# Patient Record
Sex: Female | Born: 1980 | Race: White | Hispanic: No | Marital: Single | Smoking: Never smoker
Health system: Southern US, Community
[De-identification: ages and names within clinical notes are randomized; demographics above are authoritative.]

## PROBLEM LIST (undated history)

## (undated) DIAGNOSIS — Z9889 Other specified postprocedural states: Secondary | ICD-10-CM

## (undated) DIAGNOSIS — D259 Leiomyoma of uterus, unspecified: Secondary | ICD-10-CM

## (undated) DIAGNOSIS — O3410 Maternal care for benign tumor of corpus uteri, unspecified trimester: Secondary | ICD-10-CM

## (undated) DIAGNOSIS — R112 Nausea with vomiting, unspecified: Secondary | ICD-10-CM

---

## 2007-05-05 HISTORY — PX: BREAST SURGERY: SHX581

## 2008-05-14 ENCOUNTER — Emergency Department: Payer: Self-pay | Admitting: Internal Medicine

## 2009-01-14 ENCOUNTER — Encounter: Payer: Self-pay | Admitting: Obstetrics and Gynecology

## 2009-02-01 ENCOUNTER — Encounter: Payer: Self-pay | Admitting: Obstetrics and Gynecology

## 2009-05-04 HISTORY — PX: TUBAL LIGATION: SHX77

## 2009-07-17 ENCOUNTER — Inpatient Hospital Stay: Payer: Self-pay

## 2009-09-03 ENCOUNTER — Ambulatory Visit: Payer: Self-pay | Admitting: Obstetrics and Gynecology

## 2009-09-06 ENCOUNTER — Ambulatory Visit: Payer: Self-pay | Admitting: Obstetrics and Gynecology

## 2017-12-31 ENCOUNTER — Other Ambulatory Visit: Payer: Self-pay | Admitting: Obstetrics and Gynecology

## 2017-12-31 DIAGNOSIS — D219 Benign neoplasm of connective and other soft tissue, unspecified: Secondary | ICD-10-CM

## 2018-01-16 ENCOUNTER — Ambulatory Visit
Admission: RE | Admit: 2018-01-16 | Discharge: 2018-01-16 | Disposition: A | Discharge status: Home / Self Care | Payer: 59 | Source: Ambulatory Visit | Attending: Obstetrics and Gynecology | Admitting: Obstetrics and Gynecology

## 2018-01-16 DIAGNOSIS — D251 Intramural leiomyoma of uterus: Secondary | ICD-10-CM | POA: Insufficient documentation

## 2018-01-16 DIAGNOSIS — N800 Endometriosis of uterus: Secondary | ICD-10-CM | POA: Diagnosis not present

## 2018-01-16 DIAGNOSIS — D219 Benign neoplasm of connective and other soft tissue, unspecified: Secondary | ICD-10-CM

## 2018-01-16 MED ORDER — GADOBENATE DIMEGLUMINE 529 MG/ML IV SOLN
14.0000 mL | Freq: Once | INTRAVENOUS | Status: AC | PRN
  Administered 2018-01-16: 14 mL via INTRAVENOUS

## 2018-04-14 NOTE — H&P (Signed)
Patient ID: Tracey Mueller is a 37 y.o. female presenting with Pre Op Consulting  on 04/14/2018  HPI: Pt presents with RLQ pelvic pain and increasing heavy bleeding. Ultrasound found a 12cm fibroid with an 18wk sized uterus.  MRI 01/17/2018 confirmed 14cm fibroid, and uterus is now 96GE and above umbilicus.  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  Past Surgical History:  has a past surgical history that includes Laparoscopic tubal ligation (Bilateral, 09/06/2009); Breast implants; and Tubal ligation. Family History: family history includes No Known Problems in her father and mother. Social History:  reports that she has never smoked. She has never used smokeless tobacco. She reports that she does not drink alcohol or use drugs. OB/GYN History:          OB History    Gravida  2   Para  2   Term  2   Preterm      AB      Living  2     SAB      TAB      Ectopic      Molar      Multiple      Live Births  2          Allergies: has No Known Allergies. Medications:  Current Outpatient Medications:  .  lidocaine (XYLOCAINE) 2 % jelly, Apply topically as needed for Pain. (Patient not taking: Reported on 05/21/2017 ), Disp: 30 mL, Rfl: 1   Review of Systems: No SOB, no palpitations or chest pain, no new lower extremity edema, no nausea or vomiting or bowel or bladder complaints. See HPI for gyn specific ROS.   Exam:   BP 122/85   Pulse 91   Ht 175.3 cm (5\' 9" )   Wt 73.9 kg (163 lb)   LMP 04/04/2018 (Exact Date)   BMI 24.07 kg/m   General: Patient is well-groomed, well-nourished, appears stated age in no acute distress  HEENT: head is atraumatic and normocephalic, trachea is midline, neck is  supple with no palpable nodules  CV: Regular rhythm and normal heart rate, no murmur  Pulm: Clear to auscultation throughout lung fields with no wheezing, crackles, or rhonchi. No increased work of breathing  Abdomen: soft , no mass, non-tender, no rebound tenderness, no hepatomegaly  Pelvic: tanner stage 5 ,              External genitalia: vulva /labia no lesions             Urethra: no prolapse             Vagina: normal physiologic d/c, laxity in vaginal walls             Cervix: no lesions, no cervical motion tenderness, good descent             Uterus: 22cm, 2 fingerbreadths above umbilicus and filling the right pelvis             Adnexa: no mass,  non-tender               Rectovaginal: External wnl   Impression:   The primary encounter diagnosis was Intramural leiomyoma of uterus. A diagnosis of Abnormal uterine bleeding (AUB), unspecified was also pertinent to this visit.  Plan:   Fibroids: Because her fundus now is positioned several cm above the umbilicus and, though mobile, does fill the right pelvis, we are moving from [facility] to RATLH/BS with hand morcellation using the ExCite, contained in bag at the umbilicus, Dr. Leonides Schanz and Justice Britain if possible, open above umbilicus with Hasson technique, use 3 robot arms for grasping and retraction, placement and removal of ureteral stents, possible supracervical, possible TAH, BS, cysto.  The patient and I discussed the technical aspects of the surgical procedure including the potential for risks and complications. These include but are not limited to the risk of infection requiring post-operative antibiotics or further procedures. We talked about the risk of injury to adjacent organs including bladder, bowel, ureter, blood vessels or nerves. We talked about the need to convert to an open incision. We talked about the possible need for blood transfusion. We talked aboutpostop complications such asthromboembolic or  cardiopulmonary complications. All of her questions were answered.   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

## 2018-04-15 ENCOUNTER — Other Ambulatory Visit: Payer: Self-pay

## 2018-04-15 ENCOUNTER — Encounter
Admission: RE | Admit: 2018-04-15 | Discharge: 2018-04-15 | Disposition: A | Discharge status: Home / Self Care | Payer: 59 | Source: Ambulatory Visit | Attending: Obstetrics and Gynecology | Admitting: Obstetrics and Gynecology

## 2018-04-15 ENCOUNTER — Inpatient Hospital Stay: Admission: RE | Admit: 2018-04-15 | Payer: Self-pay | Source: Ambulatory Visit

## 2018-04-15 DIAGNOSIS — Z01812 Encounter for preprocedural laboratory examination: Secondary | ICD-10-CM | POA: Diagnosis present

## 2018-04-15 HISTORY — DX: Nausea with vomiting, unspecified: R11.2

## 2018-04-15 HISTORY — DX: Maternal care for benign tumor of corpus uteri, unspecified trimester: O34.10

## 2018-04-15 HISTORY — DX: Leiomyoma of uterus, unspecified: D25.9

## 2018-04-15 HISTORY — DX: Nausea with vomiting, unspecified: Z98.890

## 2018-04-15 LAB — TYPE AND SCREEN
ABO/RH(D): O POS
Antibody Screen: NEGATIVE

## 2018-04-15 LAB — CBC
HCT: 42 % (ref 36.0–46.0)
Hemoglobin: 13.5 g/dL (ref 12.0–15.0)
MCH: 29 pg (ref 26.0–34.0)
MCHC: 32.1 g/dL (ref 30.0–36.0)
MCV: 90.3 fL (ref 80.0–100.0)
Platelets: 305 10*3/uL (ref 150–400)
RBC: 4.65 MIL/uL (ref 3.87–5.11)
RDW: 13.4 % (ref 11.5–15.5)
WBC: 6.3 10*3/uL (ref 4.0–10.5)
nRBC: 0 % (ref 0.0–0.2)

## 2018-04-15 LAB — BASIC METABOLIC PANEL
Anion gap: 5 (ref 5–15)
BUN: 15 mg/dL (ref 6–20)
CO2: 27 mmol/L (ref 22–32)
Calcium: 8.4 mg/dL — ABNORMAL LOW (ref 8.9–10.3)
Chloride: 105 mmol/L (ref 98–111)
Creatinine, Ser: 0.76 mg/dL (ref 0.44–1.00)
GFR calc Af Amer: >60 mL/min (ref >60)
GFR calc non Af Amer: >60 mL/min (ref >60)
Glucose, Bld: 119 mg/dL — ABNORMAL HIGH (ref 70–99)
Potassium: 3.4 mmol/L — ABNORMAL LOW (ref 3.5–5.1)
Sodium: 137 mmol/L (ref 135–145)

## 2018-04-15 NOTE — Patient Instructions (Addendum)
Your procedure is scheduled on: Monday 05/02/18.   Report to DAY SURGERY DEPARTMENT LOCATED ON 2ND FLOOR MEDICAL MALL ENTRANCE. To find out your arrival time please call [phone] between 1PM - 3PM on Friday 04/29/18.   Remember: Instructions that are not followed completely may result in serious medical risk, up to and including death, or upon the discretion of your surgeon and anesthesiologist your surgery may need to be rescheduled.      _X__ 1. Do not eat food after midnight the night before your procedure.                 No gum chewing or hard candies. You may drink clear liquids up to 2 hours                 before you are scheduled to arrive for your surgery- DO NOT drink clear                 liquids within 2 hours of the start of your surgery.                 Clear Liquids include:  water, apple juice without pulp, clear carbohydrate                 drink such as Clearfast or Gatorade, Black Coffee or Tea (Do not add creamer to                 coffee or tea).   __X__2.  On the morning of surgery brush your teeth with toothpaste and water, you may rinse your mouth with mouthwash if you wish.  Do not swallow any toothpaste or mouthwash.      _X__ 3.  No Alcohol for 24 hours before or after surgery.    _X__ 4.  Do Not Smoke or use e-cigarettes For 24 Hours Prior to Your Surgery.                 Do not use any chewable tobacco products for at least 6 hours prior to                 Surgery.   __X__5.  Notify your doctor if there is any change in your medical condition      (cold, fever, infections).     Do not wear jewelry, make-up, hairpins, clips or nail polish. Do not wear lotions, powders, or perfumes.  Do not shave 48 hours prior to surgery. Men may shave face and neck. Do not bring valuables to the hospital.     [facility] is not responsible for any belongings or valuables.   Contacts, dentures/partials or body piercings may not be worn into surgery. Bring a  case for your contacts, glasses or hearing aids, a denture cup will be supplied. Leave your suitcase in the car. After surgery it may be brought to your room.   For patients admitted to the hospital, discharge time is determined by your treatment team.    Patients discharged the day of surgery will not be allowed to drive home.    Please read over the following fact sheets that you were given:   MRSA Information    __X__ Take these medicines the morning of surgery with A SIP OF WATER:     1.   2.   3.   4.  5.  6.  __X__ Drink the Ensure Pre-Surgery Drink the morning of surgery on the way to the hospital.  __X__ Use CHG Soap as directed   __X__ Stop Anti-inflammatories 7 days before surgery such as Advil, Ibuprofen, Motrin, BC or Goodies Powder, Naprosyn, Naproxen, Aleve, Aspirin, Meloxicam. Last dose Monday 04/25/18.  May take Tylenol if needed for pain or discomfort.    __X__ Do not begin taking any herbal supplements before your surgery.

## 2018-05-01 MED ORDER — CEFAZOLIN SODIUM-DEXTROSE 2-4 GM/100ML-% IV SOLN
2.0000 g | INTRAVENOUS | Status: AC
  Administered 2018-05-02 (×2): 2 g via INTRAVENOUS

## 2018-05-02 ENCOUNTER — Observation Stay
Admission: RE | Admit: 2018-05-02 | Discharge: 2018-05-03 | Disposition: A | Discharge status: Home / Self Care | Payer: 59 | Source: Ambulatory Visit | Attending: Obstetrics and Gynecology | Admitting: Obstetrics and Gynecology

## 2018-05-02 ENCOUNTER — Ambulatory Visit: Payer: 59 | Admitting: Anesthesiology

## 2018-05-02 ENCOUNTER — Encounter
Admission: RE | Disposition: A | Discharge status: Home / Self Care | Payer: Self-pay | Source: Ambulatory Visit | Attending: Obstetrics and Gynecology

## 2018-05-02 ENCOUNTER — Encounter: Payer: Self-pay | Admitting: Emergency Medicine

## 2018-05-02 ENCOUNTER — Ambulatory Visit: Payer: 59

## 2018-05-02 ENCOUNTER — Other Ambulatory Visit: Payer: Self-pay

## 2018-05-02 DIAGNOSIS — D259 Leiomyoma of uterus, unspecified: Secondary | ICD-10-CM | POA: Diagnosis present

## 2018-05-02 DIAGNOSIS — D252 Subserosal leiomyoma of uterus: Secondary | ICD-10-CM | POA: Diagnosis present

## 2018-05-02 DIAGNOSIS — N8301 Follicular cyst of right ovary: Secondary | ICD-10-CM | POA: Insufficient documentation

## 2018-05-02 DIAGNOSIS — N1330 Unspecified hydronephrosis: Secondary | ICD-10-CM | POA: Diagnosis not present

## 2018-05-02 DIAGNOSIS — N800 Endometriosis of uterus: Secondary | ICD-10-CM | POA: Diagnosis not present

## 2018-05-02 DIAGNOSIS — N8311 Corpus luteum cyst of right ovary: Secondary | ICD-10-CM | POA: Insufficient documentation

## 2018-05-02 DIAGNOSIS — N920 Excessive and frequent menstruation with regular cycle: Secondary | ICD-10-CM | POA: Insufficient documentation

## 2018-05-02 HISTORY — PX: LAPAROSCOPIC UNILATERAL SALPINGO OOPHERECTOMY: SHX5935

## 2018-05-02 HISTORY — PX: LAPAROSCOPIC SUPRACERVICAL HYSTERECTOMY: SHX5399

## 2018-05-02 HISTORY — PX: CYSTOSCOPY W/ URETERAL STENT PLACEMENT: SHX1429

## 2018-05-02 HISTORY — PX: CYSTOSCOPY: SHX5120

## 2018-05-02 HISTORY — PX: UNILATERAL SALPINGECTOMY: SHX6160

## 2018-05-02 LAB — TYPE AND SCREEN
ABO/RH(D): O POS
Antibody Screen: NEGATIVE

## 2018-05-02 LAB — POCT PREGNANCY, URINE: Preg Test, Ur: NEGATIVE

## 2018-05-02 SURGERY — CYSTOSCOPY
Anesthesia: General | Laterality: Right

## 2018-05-02 MED ORDER — DEXAMETHASONE SODIUM PHOSPHATE 10 MG/ML IJ SOLN
INTRAMUSCULAR | Status: DC | PRN
  Administered 2018-05-02: 10 mg via INTRAVENOUS

## 2018-05-02 MED ORDER — LACTATED RINGERS IV SOLN
INTRAVENOUS | Status: DC
  Administered 2018-05-02 – 2018-05-03 (×2): via INTRAVENOUS

## 2018-05-02 MED ORDER — SODIUM CHLORIDE FLUSH 0.9 % IV SOLN
INTRAVENOUS | Status: AC
  Filled 2018-05-02: qty 10

## 2018-05-02 MED ORDER — CEFAZOLIN SODIUM-DEXTROSE 2-4 GM/100ML-% IV SOLN
INTRAVENOUS | Status: AC
  Filled 2018-05-02: qty 100

## 2018-05-02 MED ORDER — SUGAMMADEX SODIUM 200 MG/2ML IV SOLN
INTRAVENOUS | Status: DC | PRN
  Administered 2018-05-02: 200 mg via INTRAVENOUS

## 2018-05-02 MED ORDER — ACETAMINOPHEN 500 MG PO TABS
ORAL_TABLET | ORAL | Status: AC
  Administered 2018-05-02: 1000 mg via ORAL
  Filled 2018-05-02: qty 2

## 2018-05-02 MED ORDER — MIDAZOLAM HCL 2 MG/2ML IJ SOLN
INTRAMUSCULAR | Status: DC | PRN
  Administered 2018-05-02: 2 mg via INTRAVENOUS

## 2018-05-02 MED ORDER — GABAPENTIN 800 MG PO TABS: 800.0000 mg | ORAL_TABLET | Freq: Every day | ORAL | 0 refills | Status: DC

## 2018-05-02 MED ORDER — ROCURONIUM BROMIDE 50 MG/5ML IV SOLN
INTRAVENOUS | Status: AC
  Filled 2018-05-02: qty 3

## 2018-05-02 MED ORDER — ZOLPIDEM TARTRATE 5 MG PO TABS: 5.0000 mg | ORAL_TABLET | Freq: Every evening | ORAL | Status: DC | PRN

## 2018-05-02 MED ORDER — DEXAMETHASONE SODIUM PHOSPHATE 10 MG/ML IJ SOLN
INTRAMUSCULAR | Status: AC
  Filled 2018-05-02: qty 1

## 2018-05-02 MED ORDER — LACTATED RINGERS IV SOLN
INTRAVENOUS | Status: DC
  Administered 2018-05-02 (×3): via INTRAVENOUS

## 2018-05-02 MED ORDER — LIDOCAINE HCL (CARDIAC) PF 100 MG/5ML IV SOSY
PREFILLED_SYRINGE | INTRAVENOUS | Status: DC | PRN
  Administered 2018-05-02: 60 mg via INTRAVENOUS

## 2018-05-02 MED ORDER — ROCURONIUM BROMIDE 50 MG/5ML IV SOLN
INTRAVENOUS | Status: AC
  Filled 2018-05-02: qty 1

## 2018-05-02 MED ORDER — SUGAMMADEX SODIUM 200 MG/2ML IV SOLN
INTRAVENOUS | Status: AC
  Filled 2018-05-02: qty 2

## 2018-05-02 MED ORDER — KETAMINE HCL 50 MG/ML IJ SOLN
INTRAMUSCULAR | Status: DC | PRN
  Administered 2018-05-02: 6 mg via INTRAMUSCULAR
  Administered 2018-05-02: 30 mg via INTRAMUSCULAR

## 2018-05-02 MED ORDER — MIDAZOLAM HCL 2 MG/2ML IJ SOLN
INTRAMUSCULAR | Status: AC
  Filled 2018-05-02: qty 2

## 2018-05-02 MED ORDER — SIMETHICONE 80 MG PO CHEW
80.0000 mg | CHEWABLE_TABLET | Freq: Four times a day (QID) | ORAL | Status: DC | PRN
  Administered 2018-05-02 – 2018-05-03 (×3): 80 mg via ORAL
  Filled 2018-05-02 (×4): qty 1

## 2018-05-02 MED ORDER — FENTANYL CITRATE (PF) 100 MCG/2ML IJ SOLN
INTRAMUSCULAR | Status: DC | PRN
  Administered 2018-05-02 (×7): 50 ug via INTRAVENOUS

## 2018-05-02 MED ORDER — PROPOFOL 10 MG/ML IV BOLUS
INTRAVENOUS | Status: DC | PRN
  Administered 2018-05-02: 140 mg via INTRAVENOUS
  Administered 2018-05-02: 30 mg via INTRAVENOUS

## 2018-05-02 MED ORDER — LIDOCAINE HCL (PF) 2 % IJ SOLN
INTRAMUSCULAR | Status: AC
  Filled 2018-05-02: qty 10

## 2018-05-02 MED ORDER — ONDANSETRON HCL 4 MG/2ML IJ SOLN
INTRAMUSCULAR | Status: DC | PRN
  Administered 2018-05-02: 4 mg via INTRAVENOUS

## 2018-05-02 MED ORDER — EVICEL 2 ML EX KIT
PACK | CUTANEOUS | Status: DC | PRN
  Administered 2018-05-02: 2 mL via TOPICAL

## 2018-05-02 MED ORDER — HYDROMORPHONE HCL 1 MG/ML IJ SOLN
INTRAMUSCULAR | Status: AC
  Filled 2018-05-02: qty 1

## 2018-05-02 MED ORDER — IOPAMIDOL (ISOVUE-300) INJECTION 61%
INTRAVENOUS | Status: DC | PRN
  Administered 2018-05-02: 25 mL via URETHRAL

## 2018-05-02 MED ORDER — ALBUMIN HUMAN 5 % IV SOLN
INTRAVENOUS | Status: AC
  Filled 2018-05-02: qty 250

## 2018-05-02 MED ORDER — PROMETHAZINE HCL 25 MG/ML IJ SOLN: 6.2500 mg | INTRAMUSCULAR | Status: DC | PRN

## 2018-05-02 MED ORDER — SCOPOLAMINE 1 MG/3DAYS TD PT72
1.0000 | MEDICATED_PATCH | TRANSDERMAL | Status: DC
  Administered 2018-05-02: 1.5 mg via TRANSDERMAL

## 2018-05-02 MED ORDER — PROPOFOL 500 MG/50ML IV EMUL
INTRAVENOUS | Status: AC
  Filled 2018-05-02: qty 50

## 2018-05-02 MED ORDER — ACETAMINOPHEN 10 MG/ML IV SOLN
INTRAVENOUS | Status: AC
  Filled 2018-05-02: qty 100

## 2018-05-02 MED ORDER — CELECOXIB 200 MG PO CAPS
400.0000 mg | ORAL_CAPSULE | ORAL | Status: AC
  Administered 2018-05-02: 400 mg via ORAL

## 2018-05-02 MED ORDER — DOCUSATE SODIUM 100 MG PO CAPS: 100.0000 mg | ORAL_CAPSULE | Freq: Two times a day (BID) | ORAL | 0 refills | Status: AC

## 2018-05-02 MED ORDER — BUPIVACAINE LIPOSOME 1.3 % IJ SUSP
INTRAMUSCULAR | Status: DC | PRN
  Administered 2018-05-02: 20 mL

## 2018-05-02 MED ORDER — GABAPENTIN 300 MG PO CAPS
800.0000 mg | ORAL_CAPSULE | Freq: Every day | ORAL | Status: DC
  Administered 2018-05-02: 800 mg via ORAL
  Filled 2018-05-02: qty 2

## 2018-05-02 MED ORDER — OXYCODONE HCL 5 MG PO CAPS: 5.0000 mg | ORAL_CAPSULE | Freq: Four times a day (QID) | ORAL | 0 refills | Status: DC | PRN

## 2018-05-02 MED ORDER — DOCUSATE SODIUM 100 MG PO CAPS
100.0000 mg | ORAL_CAPSULE | Freq: Two times a day (BID) | ORAL | Status: DC
  Administered 2018-05-02 – 2018-05-03 (×2): 100 mg via ORAL
  Filled 2018-05-02 (×2): qty 1

## 2018-05-02 MED ORDER — DEXMEDETOMIDINE HCL 200 MCG/2ML IV SOLN
INTRAVENOUS | Status: DC | PRN
  Administered 2018-05-02 (×4): 4 ug via INTRAVENOUS

## 2018-05-02 MED ORDER — ACETAMINOPHEN 10 MG/ML IV SOLN
INTRAVENOUS | Status: DC | PRN
  Administered 2018-05-02: 1000 mg via INTRAVENOUS

## 2018-05-02 MED ORDER — ROCURONIUM BROMIDE 100 MG/10ML IV SOLN
INTRAVENOUS | Status: DC | PRN
  Administered 2018-05-02: 20 mg via INTRAVENOUS
  Administered 2018-05-02: 30 mg via INTRAVENOUS
  Administered 2018-05-02: 20 mg via INTRAVENOUS
  Administered 2018-05-02: 10 mg via INTRAVENOUS
  Administered 2018-05-02 (×3): 20 mg via INTRAVENOUS
  Administered 2018-05-02: 50 mg via INTRAVENOUS

## 2018-05-02 MED ORDER — PHENYLEPHRINE HCL 10 MG/ML IJ SOLN
INTRAMUSCULAR | Status: AC
  Filled 2018-05-02: qty 1

## 2018-05-02 MED ORDER — FENTANYL CITRATE (PF) 250 MCG/5ML IJ SOLN
INTRAMUSCULAR | Status: AC
  Filled 2018-05-02: qty 5

## 2018-05-02 MED ORDER — ONDANSETRON HCL 4 MG PO TABS: 4.0000 mg | ORAL_TABLET | Freq: Four times a day (QID) | ORAL | Status: DC | PRN

## 2018-05-02 MED ORDER — ACETAMINOPHEN 500 MG PO TABS
1000.0000 mg | ORAL_TABLET | Freq: Four times a day (QID) | ORAL | Status: DC
  Administered 2018-05-02 – 2018-05-03 (×3): 1000 mg via ORAL
  Filled 2018-05-02 (×3): qty 2

## 2018-05-02 MED ORDER — ACETAMINOPHEN 500 MG PO TABS: 1000.0000 mg | ORAL_TABLET | Freq: Four times a day (QID) | ORAL | 0 refills | Status: AC

## 2018-05-02 MED ORDER — SCOPOLAMINE 1 MG/3DAYS TD PT72
MEDICATED_PATCH | TRANSDERMAL | Status: AC
  Administered 2018-05-02: 1.5 mg via TRANSDERMAL
  Filled 2018-05-02: qty 1

## 2018-05-02 MED ORDER — IBUPROFEN 800 MG PO TABS: 800.0000 mg | ORAL_TABLET | Freq: Three times a day (TID) | ORAL | 1 refills | Status: AC | PRN

## 2018-05-02 MED ORDER — ONDANSETRON HCL 4 MG/2ML IJ SOLN
INTRAMUSCULAR | Status: AC
  Filled 2018-05-02: qty 2

## 2018-05-02 MED ORDER — LACTATED RINGERS IV SOLN
INTRAVENOUS | Status: DC | PRN
  Administered 2018-05-02: 08:00:00 via INTRAVENOUS

## 2018-05-02 MED ORDER — ENSURE ENLIVE PO LIQD: 296.0000 mL | Freq: Once | ORAL | Status: DC

## 2018-05-02 MED ORDER — TRANEXAMIC ACID-NACL 1000-0.7 MG/100ML-% IV SOLN
INTRAVENOUS | Status: DC | PRN
  Administered 2018-05-02: 1000 mg via INTRAVENOUS

## 2018-05-02 MED ORDER — MENTHOL 3 MG MT LOZG
1.0000 | LOZENGE | OROMUCOSAL | Status: DC | PRN
  Filled 2018-05-02: qty 9

## 2018-05-02 MED ORDER — GLUCERNA 1.2 CAL PO LIQD: 592.0000 mL | Freq: Once | ORAL | Status: DC

## 2018-05-02 MED ORDER — EVICEL 2 ML EX KIT
PACK | CUTANEOUS | Status: AC
  Filled 2018-05-02: qty 1

## 2018-05-02 MED ORDER — GABAPENTIN 300 MG PO CAPS
ORAL_CAPSULE | ORAL | Status: AC
  Administered 2018-05-02: 300 mg via ORAL
  Filled 2018-05-02: qty 1

## 2018-05-02 MED ORDER — HYDROMORPHONE HCL 1 MG/ML IJ SOLN
0.2500 mg | INTRAMUSCULAR | Status: DC | PRN
  Administered 2018-05-02 (×4): 0.25 mg via INTRAVENOUS

## 2018-05-02 MED ORDER — PHENYLEPHRINE HCL 10 MG/ML IJ SOLN
INTRAMUSCULAR | Status: DC | PRN
  Administered 2018-05-02 (×2): 100 ug via INTRAVENOUS

## 2018-05-02 MED ORDER — KETAMINE HCL 50 MG/ML IJ SOLN
INTRAMUSCULAR | Status: AC
  Filled 2018-05-02: qty 10

## 2018-05-02 MED ORDER — PROPOFOL 10 MG/ML IV BOLUS
INTRAVENOUS | Status: AC
  Filled 2018-05-02: qty 40

## 2018-05-02 MED ORDER — SEVOFLURANE IN SOLN
RESPIRATORY_TRACT | Status: AC
  Filled 2018-05-02: qty 250

## 2018-05-02 MED ORDER — PROPOFOL 500 MG/50ML IV EMUL
INTRAVENOUS | Status: DC | PRN
  Administered 2018-05-02: 10:00:00 via INTRAVENOUS
  Administered 2018-05-02: 75 ug/kg/min via INTRAVENOUS
  Administered 2018-05-02: 12:00:00 via INTRAVENOUS

## 2018-05-02 MED ORDER — GABAPENTIN 300 MG PO CAPS
300.0000 mg | ORAL_CAPSULE | ORAL | Status: AC
  Administered 2018-05-02: 300 mg via ORAL

## 2018-05-02 MED ORDER — BUPIVACAINE HCL (PF) 0.5 % IJ SOLN
INTRAMUSCULAR | Status: AC
  Filled 2018-05-02: qty 30

## 2018-05-02 MED ORDER — CELECOXIB 200 MG PO CAPS
ORAL_CAPSULE | ORAL | Status: AC
  Administered 2018-05-02: 400 mg via ORAL
  Filled 2018-05-02: qty 2

## 2018-05-02 MED ORDER — ACETAMINOPHEN 500 MG PO TABS
1000.0000 mg | ORAL_TABLET | ORAL | Status: AC
  Administered 2018-05-02: 1000 mg via ORAL

## 2018-05-02 MED ORDER — ALBUMIN HUMAN 5 % IV SOLN
INTRAVENOUS | Status: DC | PRN
  Administered 2018-05-02 (×2): via INTRAVENOUS

## 2018-05-02 MED ORDER — STERILE WATER FOR IRRIGATION IR SOLN
Status: DC | PRN
  Administered 2018-05-02: 180 mL via INTRAVESICAL

## 2018-05-02 MED ORDER — IBUPROFEN 800 MG PO TABS: 800.0000 mg | ORAL_TABLET | Freq: Four times a day (QID) | ORAL | Status: DC

## 2018-05-02 MED ORDER — ALUM & MAG HYDROXIDE-SIMETH 200-200-20 MG/5ML PO SUSP
30.0000 mL | ORAL | Status: DC | PRN
  Administered 2018-05-02: 30 mL via ORAL
  Filled 2018-05-02: qty 30

## 2018-05-02 MED ORDER — DEXMEDETOMIDINE HCL IN NACL 80 MCG/20ML IV SOLN
INTRAVENOUS | Status: AC
  Filled 2018-05-02: qty 20

## 2018-05-02 MED ORDER — ONDANSETRON HCL 4 MG/2ML IJ SOLN
4.0000 mg | Freq: Four times a day (QID) | INTRAMUSCULAR | Status: DC | PRN
  Administered 2018-05-02: 4 mg via INTRAVENOUS
  Filled 2018-05-02: qty 2

## 2018-05-02 MED ORDER — KETOROLAC TROMETHAMINE 30 MG/ML IJ SOLN: 30.0000 mg | Freq: Once | INTRAMUSCULAR | Status: DC

## 2018-05-02 MED ORDER — FENTANYL CITRATE (PF) 100 MCG/2ML IJ SOLN
INTRAMUSCULAR | Status: AC
  Filled 2018-05-02: qty 2

## 2018-05-02 MED ORDER — KETOROLAC TROMETHAMINE 30 MG/ML IJ SOLN
30.0000 mg | Freq: Four times a day (QID) | INTRAMUSCULAR | Status: DC
  Administered 2018-05-02 – 2018-05-03 (×3): 30 mg via INTRAVENOUS
  Filled 2018-05-02 (×3): qty 1

## 2018-05-02 MED ORDER — BUPIVACAINE LIPOSOME 1.3 % IJ SUSP
INTRAMUSCULAR | Status: AC
  Filled 2018-05-02: qty 20

## 2018-05-02 SURGICAL SUPPLY — 123 items
ANCHOR TIS RET SYS 1550ML (BAG) ×5 IMPLANT
BAG URINE DRAINAGE (UROLOGICAL SUPPLIES) ×5 IMPLANT
BLADE SURG SZ10 CARB STEEL (BLADE) ×5 IMPLANT
BLADE SURG SZ11 CARB STEEL (BLADE) ×60 IMPLANT
CANISTER SUCT 1200ML W/VALVE (MISCELLANEOUS) ×5 IMPLANT
CANNULA SEALS 8.5MM (CANNULA)
CATH FOLEY 2WAY  5CC 16FR (CATHETERS) ×2
CATH FOLEY 2WAY 5CC 18FR (CATHETERS) ×5 IMPLANT
CATH ROBINSON RED A/P 16FR (CATHETERS) ×5 IMPLANT
CATH URETL 5X70 OPEN END (CATHETERS) ×5 IMPLANT
CATH URETL WHISTLE TIP 4FR (CATHETERS) ×5 IMPLANT
CATH URTH 16FR FL 2W BLN LF (CATHETERS) ×3 IMPLANT
CHLORAPREP W/TINT 26ML (MISCELLANEOUS) ×5 IMPLANT
CLOSURE WOUND 1/2 X4 (GAUZE/BANDAGES/DRESSINGS) ×1
CORD BIP STRL DISP 12FT (MISCELLANEOUS) ×5 IMPLANT
COVER TIP SHEARS 8 DVNC (MISCELLANEOUS) IMPLANT
COVER TIP SHEARS 8MM DA VINCI (MISCELLANEOUS)
COVER WAND RF STERILE (DRAPES) ×5 IMPLANT
CRADLE LAMINECT ARM (MISCELLANEOUS) ×5 IMPLANT
DEFOGGER SCOPE WARMER CLEARIFY (MISCELLANEOUS) ×10 IMPLANT
DERMABOND ADVANCED (GAUZE/BANDAGES/DRESSINGS) ×2
DERMABOND ADVANCED .7 DNX12 (GAUZE/BANDAGES/DRESSINGS) ×3 IMPLANT
DRAPE 3 ARM ACCESS DA VINCI (DRAPES)
DRAPE 3 ARM ACCESS DVNC (DRAPES) IMPLANT
DRAPE C-ARM XRAY 36X54 (DRAPES) ×5 IMPLANT
DRAPE LAP W/FLUID (DRAPES) IMPLANT
DRAPE LEGGINS SURG 28X43 STRL (DRAPES) ×5 IMPLANT
DRAPE SHEET LG 3/4 BI-LAMINATE (DRAPES) ×10 IMPLANT
DRAPE STERI POUCH LG 24X46 STR (DRAPES) ×5 IMPLANT
DRAPE UNDER BUTTOCK W/FLU (DRAPES) ×5 IMPLANT
DRSG TEGADERM 2-3/8X2-3/4 SM (GAUZE/BANDAGES/DRESSINGS) ×15 IMPLANT
DRSG TELFA 3X8 NADH (GAUZE/BANDAGES/DRESSINGS) ×5 IMPLANT
ELECT CAUTERY BLADE 6.4 (BLADE) IMPLANT
ELECT REM PT RETURN 9FT ADLT (ELECTROSURGICAL) ×5
ELECTRODE REM PT RTRN 9FT ADLT (ELECTROSURGICAL) ×3 IMPLANT
FILTER LAP SMOKE EVAC STRL (MISCELLANEOUS) ×5 IMPLANT
GAUZE 4X4 16PLY RFD (DISPOSABLE) ×5 IMPLANT
GAUZE SPONGE 4X4 12PLY STRL (GAUZE/BANDAGES/DRESSINGS) ×5 IMPLANT
GLOVE BIO SURGEON STRL SZ7 (GLOVE) ×20 IMPLANT
GLOVE INDICATOR 7.5 STRL GRN (GLOVE) ×20 IMPLANT
GOWN STRL REUS W/ TWL LRG LVL3 (GOWN DISPOSABLE) ×12 IMPLANT
GOWN STRL REUS W/ TWL XL LVL3 (GOWN DISPOSABLE) ×3 IMPLANT
GOWN STRL REUS W/TWL LRG LVL3 (GOWN DISPOSABLE) ×8
GOWN STRL REUS W/TWL XL LVL3 (GOWN DISPOSABLE) ×2
GRASPER SUT TROCAR 14GX15 (MISCELLANEOUS) ×5 IMPLANT
HANDLE YANKAUER SUCT BULB TIP (MISCELLANEOUS) ×5 IMPLANT
IRRIGATION STRYKERFLOW (MISCELLANEOUS) ×3 IMPLANT
IRRIGATOR STRYKERFLOW (MISCELLANEOUS) ×5
IV LACTATED RINGERS 1000ML (IV SOLUTION) ×10 IMPLANT
IV NS 1000ML (IV SOLUTION) ×2
IV NS 1000ML BAXH (IV SOLUTION) ×3 IMPLANT
KIT PINK PAD W/HEAD ARE REST (MISCELLANEOUS) ×5
KIT PINK PAD W/HEAD ARM REST (MISCELLANEOUS) ×3 IMPLANT
KIT TURNOVER CYSTO (KITS) ×5 IMPLANT
L-HOOK LAP DISP 36CM (ELECTROSURGICAL) ×5
LABEL OR SOLS (LABEL) ×5 IMPLANT
LHOOK LAP DISP 36CM (ELECTROSURGICAL) ×3 IMPLANT
LIGASURE VESSEL 5MM BLUNT TIP (ELECTROSURGICAL) ×5 IMPLANT
MANIPULATOR VCARE LG CRV RETR (MISCELLANEOUS) IMPLANT
MANIPULATOR VCARE SML CRV RETR (MISCELLANEOUS) IMPLANT
MANIPULATOR VCARE STD CRV RETR (MISCELLANEOUS) ×5 IMPLANT
MORCELLATOR XCISE  COR (MISCELLANEOUS)
MORCELLATOR XCISE COR (MISCELLANEOUS) IMPLANT
NS IRRIG 1000ML POUR BTL (IV SOLUTION) ×5 IMPLANT
NS IRRIG 500ML POUR BTL (IV SOLUTION) ×5 IMPLANT
OCCLUDER COLPOPNEUMO (BALLOONS) ×5 IMPLANT
PACK BASIN MAJOR ARMC (MISCELLANEOUS) IMPLANT
PACK GYN LAPAROSCOPIC (MISCELLANEOUS) ×5 IMPLANT
PAD OB MATERNITY 4.3X12.25 (PERSONAL CARE ITEMS) ×5 IMPLANT
PAD PREP 24X41 OB/GYN DISP (PERSONAL CARE ITEMS) ×5 IMPLANT
PENCIL ELECTRO HAND CTR (MISCELLANEOUS) ×10 IMPLANT
RETRACTOR RING XSMALL (MISCELLANEOUS) ×3 IMPLANT
RTRCTR WOUND ALEXIS 13CM XS SH (MISCELLANEOUS) ×5
SCISSORS METZENBAUM CVD 33 (INSTRUMENTS) ×5 IMPLANT
SEAL CANN 8.5 DVNC (CANNULA) IMPLANT
SENSORWIRE 0.038 NOT ANGLED (WIRE) ×5
SET CYSTO W/LG BORE CLAMP LF (SET/KITS/TRAYS/PACK) ×10 IMPLANT
SOL PREP PVP 2OZ (MISCELLANEOUS) ×5
SOLUTION ELECTROLUBE (MISCELLANEOUS) ×5 IMPLANT
SOLUTION PREP PVP 2OZ (MISCELLANEOUS) ×3 IMPLANT
SPONGE LAP 18X18 RF (DISPOSABLE) ×5 IMPLANT
STAPLER SKIN PROX 35W (STAPLE) IMPLANT
STENT URET 6FRX26 CONTOUR (STENTS) IMPLANT
STENT URET 6FRX28 CONTOUR (STENTS) ×5 IMPLANT
STRIP CLOSURE SKIN 1/2X4 (GAUZE/BANDAGES/DRESSINGS) ×4 IMPLANT
SURGILUBE 2OZ TUBE FLIPTOP (MISCELLANEOUS) ×5 IMPLANT
SUT DVC VLOC 180 0 12IN GS21 (SUTURE)
SUT MNCRL 4-0 (SUTURE) ×2
SUT MNCRL 4-0 27XMFL (SUTURE) ×3
SUT VIC AB 0 CT1 27 (SUTURE)
SUT VIC AB 0 CT1 27XCR 8 STRN (SUTURE) IMPLANT
SUT VIC AB 0 CT1 36 (SUTURE) IMPLANT
SUT VIC AB 0 CT2 27 (SUTURE) ×5 IMPLANT
SUT VIC AB 1 CT1 36 (SUTURE) ×5 IMPLANT
SUT VIC AB 2-0 CT1 27 (SUTURE) ×2
SUT VIC AB 2-0 CT1 TAPERPNT 27 (SUTURE) ×3 IMPLANT
SUT VIC AB 2-0 SH 27 (SUTURE)
SUT VIC AB 2-0 SH 27XBRD (SUTURE) IMPLANT
SUT VIC AB 2-0 UR6 27 (SUTURE) IMPLANT
SUT VIC AB 4-0 FS2 27 (SUTURE) IMPLANT
SUT VIC AB 4-0 SH 27 (SUTURE)
SUT VIC AB 4-0 SH 27XANBCTRL (SUTURE) IMPLANT
SUT VICRYL 0 AB UR-6 (SUTURE) ×5 IMPLANT
SUT VICRYL 0 UR6 27IN ABS (SUTURE) ×10 IMPLANT
SUT VICRYL PLUS ABS 0 54 (SUTURE) IMPLANT
SUTURE DVC VLC 180 0 12IN GS21 (SUTURE) IMPLANT
SUTURE MNCRL 4-0 27XMF (SUTURE) ×3 IMPLANT
SYR 10ML LL (SYRINGE) ×10 IMPLANT
SYR 50ML LL SCALE MARK (SYRINGE) ×5 IMPLANT
SYR BULB IRRIG 60ML STRL (SYRINGE) IMPLANT
TIP RIGID 35CM EVICEL (HEMOSTASIS) ×5 IMPLANT
TRAY FOLEY MTR SLVR 16FR STAT (SET/KITS/TRAYS/PACK) ×5 IMPLANT
TROCAR 12M 150ML BLUNT (TROCAR) IMPLANT
TROCAR DISP BLADELESS 8 DVNC (TROCAR) IMPLANT
TROCAR DISP BLADELESS 8MM (TROCAR)
TROCAR ENDO BLADELESS 11MM (ENDOMECHANICALS) ×5 IMPLANT
TROCAR XCEL 12X100 BLDLESS (ENDOMECHANICALS) IMPLANT
TROCAR XCEL UNIV SLVE 11M 100M (ENDOMECHANICALS) ×10 IMPLANT
TUBING CONNECTING 10 (TUBING) ×8 IMPLANT
TUBING CONNECTING 10' (TUBING) ×2
TUBING INSUF HEATED (TUBING) ×5 IMPLANT
WATER STERILE IRR 1000ML POUR (IV SOLUTION) ×5 IMPLANT
WIRE SENSOR 0.038 NOT ANGLED (WIRE) ×3 IMPLANT

## 2018-05-02 NOTE — Anesthesia Postprocedure Evaluation (Signed)
Anesthesia Post Note  Patient: Tracey Mueller  Procedure(s) Performed: CYSTOSCOPY WITH URETERAL STENT (RIGHT) AND REMOVAL OF URETERAL STENT (BEASLEY) (Right ) LAPAROSCOPIC SUPRACERVICAL HYSTERECTOMY (N/A ) LAPAROSCOPIC UNILATERAL SALPINGO OOPHORECTOMY (Right ) UNILATERAL SALPINGECTOMY (Left ) CYSTOSCOPY WITH RETROGRADE PYELOGRAM/URETERAL STENT PLACEMENT (Right )  Patient location during evaluation: PACU Anesthesia Type: General Level of consciousness: awake and alert Pain management: pain level controlled Vital Signs Assessment: post-procedure vital signs reviewed and stable Respiratory status: spontaneous breathing, nonlabored ventilation, respiratory function stable and patient connected to nasal cannula oxygen Cardiovascular status: blood pressure returned to baseline and stable Postop Assessment: no apparent nausea or vomiting Anesthetic complications: no     Last Vitals:  Vitals:   05/02/18 1517 05/02/18 1532  BP: (!) 118/56 122/78  Pulse: 100 98  Resp: 18 19  Temp: 36.7 C   SpO2: 100% 100%    Last Pain:  Vitals:   05/02/18 1517  TempSrc:   PainSc: 0-No pain                 Durenda Hurt

## 2018-05-02 NOTE — Transfer of Care (Signed)
Immediate Anesthesia Transfer of Care Note  Patient: Tracey Mueller  Procedure(s) Performed: CYSTOSCOPY WITH URETERAL STENT (RIGHT) AND REMOVAL OF URETERAL STENT (BEASLEY) (Right ) LAPAROSCOPIC SUPRACERVICAL HYSTERECTOMY (N/A ) LAPAROSCOPIC UNILATERAL SALPINGO OOPHORECTOMY (Right ) UNILATERAL SALPINGECTOMY (Left ) CYSTOSCOPY WITH RETROGRADE PYELOGRAM/URETERAL STENT PLACEMENT (Right )  Patient Location: PACU  Anesthesia Type:General  Level of Consciousness: drowsy  Airway & Oxygen Therapy: Patient Spontanous Breathing and Patient connected to face mask oxygen  Post-op Assessment: Report given to RN and Post -op Vital signs reviewed and stable  Post vital signs: stable  Last Vitals:  Vitals Value Taken Time  BP 118/56 05/02/2018  3:17 PM  Temp    Pulse 102 05/02/2018  3:20 PM  Resp 18 05/02/2018  3:20 PM  SpO2 100 % 05/02/2018  3:20 PM  Vitals shown include unvalidated device data.  Last Pain:  Vitals:   05/02/18 0643  TempSrc: Oral  PainSc: 0-No pain         Complications: No apparent anesthesia complications

## 2018-05-02 NOTE — Anesthesia Post-op Follow-up Note (Signed)
Anesthesia QCDR form completed.        

## 2018-05-02 NOTE — Anesthesia Procedure Notes (Signed)
Procedure Name: Intubation Date/Time: 05/02/2018 7:59 AM Performed by: Lavone Orn, CRNA Pre-anesthesia Checklist: Patient identified, Emergency Drugs available, Suction available, Patient being monitored and Timeout performed Patient Re-evaluated:Patient Re-evaluated prior to induction Oxygen Delivery Method: Circle system utilized Preoxygenation: Pre-oxygenation with 100% oxygen Induction Type: IV induction Ventilation: Mask ventilation without difficulty Laryngoscope Size: Mac and 4 Grade View: Grade I Tube type: Oral Number of attempts: 1 Airway Equipment and Method: Stylet Placement Confirmation: ETT inserted through vocal cords under direct vision,  positive ETCO2 and breath sounds checked- equal and bilateral Secured at: 22 cm Tube secured with: Tape Dental Injury: Teeth and Oropharynx as per pre-operative assessment

## 2018-05-02 NOTE — Op Note (Addendum)
Marisue Humble PROCEDURE DATE: 05/02/2018  PREOPERATIVE DIAGNOSIS: Menorrhagia, 22 cm fibroid uterus POSTOPERATIVE DIAGNOSIS: The same - right broad ligament fibroid, 14cm by ultrasound - endometriosis  Urology was requested for an intraoperative consult to evaluate the right ureter after we had completed morcellation.  PROCEDURE: Supracervical laparoscopic hysterectomy, bilateral salpingectomy, right oophorectomy, placement and removal of right ureteral stent cystoscopy prior to the procedure, radical hysterectomy on the right including the broad ligament and extensive retroperitoneal dissection into the right pelvic sidewall. Right ureteral dissection   SURGEON:  Dr. Benjaman Kindler ASSISTANT: Dr. Vikki Ports Ward Anesthesiologist:  Anesthesiologist: Durenda Hurt, MD CRNA: Aline Brochure, CRNA; Lavone Orn, Immunologist; Geraldine Contras, CRNA; Jonna Clark, CRNA  INDICATIONS: 37 y.o. F here for definitive surgical management secondary to the indications listed under preoperative diagnoses; please see preoperative note for further details.  Risks of surgery were discussed with the patient including but not limited to: bleeding which may require transfusion or reoperation; infection which may require antibiotics; injury to bowel, bladder, ureters or other surrounding organs; need for additional procedures; thromboembolic phenomenon, incisional problems and other postoperative/anesthesia complications. Written informed consent was obtained.    FINDINGS: Significantly enlarged fibroid uterus weighing 1793g in total, with a large broad non-pedunculated fibroid into the right pelvic sidewall, encompassing the right anterior right lateral sidewall to the level of the pelvic inlet.  The fibroid had grown out of the right lateral uterus, and displaced the uterus to the left so that the right ovary and tube were actually in the left pelvis and the uterus was compressed against the  left pelvic sidewall by the wide and broad fibroid that extended significantly into the retroperitoneal space on the right.  Several areas of powder burn endometriosis implants were noted over the left ureter.  These were left intact.  The left ovary and tube were normal in appearance.  Normal upper abdomen.  ANESTHESIA:    General INTRAVENOUS FLUIDS:3200  ml ESTIMATED BLOOD LOSS:875 ml URINE OUTPUT: 650 ml   SPECIMENS: Uterus, cervix, bilateral fallopian tubes, right ovary COMPLICATIONS: None immediate  PROCEDURE IN DETAIL:  The patient received prophalactic intravenous antibiotics and had sequential compression devices applied to her lower extremities while in the preoperative area.  She was then taken to the operating room where general anesthesia was administered and was found to be adequate.  She was placed in the dorsal lithotomy position, and was prepped and draped in a sterile manner.  A formal time out was performed with all team members present and in agreement.  A V-care uterine manipulator was placed at this time.  A Foley catheter was inserted into her bladder and attached to constant drainage, and a right ureteral stent was placed.. Attention was turned to the abdomen and 0.5% Marcaine infused subq about 3cm above the umbilicus.  A 11 mm incision was made with the scalpel.  The Optiview 10-mm trocar and sleeve were then advanced without difficulty with the laparoscope under direct visualization into the abdomen.  The abdomen was then insufflated with carbon dioxide gas and adequate pneumoperitoneum was obtained.  A survey of the patient's pelvis and abdomen revealed the findings above.  Bilateral lower quadrant ports (11 mm on the right and 11 mm on the left) were then placed under direct visualization.  An 11 mm port was placed suprapubically later in the case as well.    The pelvis was then carefully examined with the findings noted above.  We could see the cervical cup posteriorly  and decided to continue laparoscopically.  Attention was turned to the left fallopian tube; this was freed from the underlying mesosalpinx and the uterine attachments using the Ligasure device.  The bilateral round and broad ligaments were then clamped and transected with the Ligasure device on the left, and the utero-ovarian artery was carefully fulgurated and dissected.  The uterine artery was then skeletonized and a bladder flap was created, with 120 mL of bladder backfilled to elucidate the bladder.  The ureters were noted to be safely away from the area of dissection.  The bladder was then bluntly dissected off the lower uterine segment.    At this point, attention was turned to the uterine vessels, which were clamped and cauterized using the Ligasure on the left, and then the right. After the uterine blood flow at the level of the internal os was controlled, both arteries were cut with the Ligasure.  Good hemostasis was noted overall.  The left uterosacral and cardinal ligaments were clamped, cut and ligated bilaterally with significant bleeding at the left uterine artery insertion.   Attention was turned to the right pelvis and the edges of the fibroid under the peritoneum carefully delineated.  The right ureter was noted to be close to the lateral edge of the fibroid along the pelvic sidewall at the pelvic brim.  The right IP was extensively displaced medially to cross to the right ovary and tube which were in the left lower pelvis under the fibroid.  The right IP was carefully skeletonized and triply clamped and cut, being careful to avoid the ureter at the pelvic brim.  The next part of the surgery comprised most of the time taken.  The fibroid was carefully delineated by dissecting the peritoneum away from the pelvic sidewall, and exploration into the retroperitoneal space on the right revealed the extent of the fibroid.  The iliac vessels were not visualized and were kept lateral and cephalad.  The  fibroid did extend to the right margin of the bladder, which remained backfilled.  With careful attention and retraction, the fibroid was carefully dissected out of the space, and hemostasis managed with exquisite attention.  We dissected down to the level of the cervix, being careful to stay away from the ureter and bowels in the posterior cul-de-sac.  Using cautery we transected the cervix, and a fulgurator was used to cauterize the endocervical canal.  Bleeding was noted at the right lateral edge of the cervix just medial to the ureter's sharp superior turn towards the insertion into the bladder, and after cautery control of the bleeding, the ureter was noted to be uncomfortably close.  For this reason urology was asked to step in and evaluate the right ureter.  The right ureter was peristalsing throughout the procedure and after this cautery.  Please see urology's notes for details.  The patient was part placed in reverse Trendelenburg and the pelvis irrigated and all blood attempted to be removed.  Pressure was dropped, and all surfaces were noted to be hemostatic.  The ureters were examined bilaterally and were pulsating normally  Attention was turned to the external abdomen, and the suprapubic port incision was extended into a mini laparotomy.  A large Endo Catch bag was threaded through this incision and the specimen attempted to be encased within.  However, the specimen rested on the top edge of the bag, and was not able to be entirely enclosed.  The specimen was brought to the mini laparotomy with the bag below it, and  hand morcellation was undertaken with an Paramedic.  This morcellation also comprised a significant portion of the case.  It was undertaken without difficulty.  The minilaparotomy fascia was closed with 0 Vicryl in a running fashion.  And the subcu tissue brought together in the midline as well.  Thorough evaluation of the pelvis and abdomen revealed no damage to the  bowels, the sidewall or any other parts of the pelvis.   The other 17mm ports fasciae were closed with a vertical mattress with 0-Vicryl, using the cone closure system. All trocars were removed under direct visualization, and the abdomen was desufflated.  All skin incisions were closed with 4-0 Vicryl subcuticular stitches and Dermabond. The patient tolerated the procedures well.  All instruments, needles, and sponge counts were correct x 2. The patient was taken to the recovery room awake, extubated and in stable condition.   At this time, urology was invited to the room, and the patient was undraped and moved to a C arm compatible bed.  She was redraped in a sterile fashion.  Please see his notes for his evaluation.  She will stay overnight for monitoring, and will have postoperative ERAS protocol as well.  She will follow-up for removal of the right ureteral stent that urology placed in his office in 4 weeks.

## 2018-05-02 NOTE — Interval H&P Note (Signed)
History and Physical Interval Note:  05/02/2018 7:47 AM  Marisue Humble  has presented today for surgery, with the diagnosis of Uterine fibroid, Menorrhagia, Pelvic pain  The various methods of treatment have been discussed with the patient and family. After consideration of risks, benefits and other options for treatment, the patient has consented to  Procedure(s): ROBOTIC ASSISTED TOTAL HYSTERECTOMY WITH SALPINGECTOMY (N/A) CYSTOSCOPY WITH URETERAL STENTS (N/A) POSSIBLE HYSTERECTOMY ABDOMINAL (N/A) POSSIBLE LAPAROSCOPIC SUPRACERVICAL HYSTERECTOMY (N/A) as a surgical intervention .  The patient's history has been reviewed, patient examined, no change in status, stable for surgery.  I have reviewed the patient's chart and labs.  Questions were answered to the patient's satisfaction.    The surgery above was scheduled, but the robot is not working as well as it should due to movement and drift in arm 1. Therefore, we have decided to proceed with TLH, BS and hand morcellation instead. The risk of converting to open, the possibility of extra operative time, were discussed with patient. She and her family has had an opportunity to ask all their questions and I have answered to the best of my ability.  Benjaman Kindler

## 2018-05-02 NOTE — Anesthesia Preprocedure Evaluation (Addendum)
Anesthesia Evaluation  Patient identified by MRN, date of birth, ID band Patient awake    Reviewed: Allergy & Precautions, H&P , NPO status , Patient's Chart, lab work & pertinent test results  History of Anesthesia Complications (+) PONV and history of anesthetic complications  Airway Mallampati: I  TM Distance: >3 FB     Dental  (+) Teeth Intact   Pulmonary neg pulmonary ROS, neg shortness of breath, neg COPD, neg recent URI,           Cardiovascular (-) hypertensionnegative cardio ROS       Neuro/Psych negative neurological ROS  negative psych ROS   GI/Hepatic negative GI ROS, Neg liver ROS,   Endo/Other  negative endocrine ROS  Renal/GU      Musculoskeletal   Abdominal   Peds  Hematology negative hematology ROS (+)   Anesthesia Other Findings Past Medical History: No date: PONV (postoperative nausea and vomiting) No date: Uterine fibroids in pregnancy, postpartum condition  Past Surgical History: 2009: BREAST SURGERY     Comment:  Implants 2011: TUBAL LIGATION  BMI    Body Mass Index:  24.07 kg/m      Reproductive/Obstetrics negative OB ROS                            Anesthesia Physical Anesthesia Plan  ASA: II  Anesthesia Plan: General ETT   Post-op Pain Management:    Induction:   PONV Risk Score and Plan: Ondansetron, Dexamethasone, Midazolam, Treatment may vary due to age or medical condition, Scopolamine patch - Pre-op and Propofol infusion  Airway Management Planned:   Additional Equipment:   Intra-op Plan:   Post-operative Plan:   Informed Consent: I have reviewed the patients History and Physical, chart, labs and discussed the procedure including the risks, benefits and alternatives for the proposed anesthesia with the patient or authorized representative who has indicated his/her understanding and acceptance.   Dental Advisory Given  Plan Discussed  with: Anesthesiologist, CRNA and Surgeon  Anesthesia Plan Comments:        Anesthesia Quick Evaluation

## 2018-05-02 NOTE — Interval H&P Note (Signed)
History and Physical Interval Note:  05/02/2018 7:50 AM  Tracey Mueller  has presented today for surgery, with the diagnosis of Uterine fibroid, Menorrhagia, Pelvic pain  The various methods of treatment have been discussed with the patient and family. After consideration of risks, benefits and other options for treatment, the patient has consented to  Procedure(s): ROBOTIC ASSISTED TOTAL HYSTERECTOMY WITH SALPINGECTOMY (N/A) CYSTOSCOPY WITH URETERAL STENTS (N/A) POSSIBLE HYSTERECTOMY ABDOMINAL (N/A) POSSIBLE LAPAROSCOPIC SUPRACERVICAL HYSTERECTOMY (N/A) as a surgical intervention .  The patient's history has been reviewed, patient examined, no change in status, stable for surgery.  I have reviewed the patient's chart and labs.  Questions were answered to the patient's satisfaction.    To confirm, the endometrial biopsy was negative.  Benjaman Kindler

## 2018-05-02 NOTE — Op Note (Signed)
Date of procedure: 05/02/18  Preoperative diagnosis:  1. Large uterine fibroid  Postoperative diagnosis:  1. Same  Procedure: 1. Cystoscopy, right retrograde pyelogram, right ureteral stent placement  Surgeon: Nickolas Madrid, MD  Anesthesia: General  Complications: None  Intraoperative findings:  1.  Cystoscopy with minor Foley trauma at trigone 2.  Right retrograde pyelogram with no extravasation or narrowing 3.  Mild to moderate right hydronephrosis 4.  Uncomplicated right 6 [place] by 28 cm ureteral stent  EBL: None for urology portion of case  Specimens: None  Drains: Right 6 French by 28 cm ureteral stent, 18 French Foley  Description of procedure: Tracey Mueller is a 37 y.o. patient with large uterine fibroid who was currently undergoing laparoscopic hysterectomy with Dr. Leafy Ro of the GYN service.  I was consulted intraoperatively for possible right ureteral injury.  The patient was intubated and draped with laparoscopic ports in place on my arrival.  The area of concern was in the right mid to distal ureter where Dr. Leafy Ro was concerned for possible cautery injury adjacent to the right ureter.  Under laparoscopic vision, this area of cautery appeared to be just medial to the track of the right ureter.  Peristalsis was seen in the right ureter.  There was no extravasation of urine.  There was no concern for bladder or left ureteral injury.  We discussed options for management including observation versus cystoscopy with right retrograde pyelogram and possible stent placement.  We elected to proceed with cystoscopy and right retrograde pyelogram to evaluate for any ureteral narrowing or extravasation.  When Dr. Leafy Ro had finished her portion of the case, the patient was reprepped and draped in standard sterile fashion in low lithotomy.  A 21 French rigid cystoscope was used to intubate the urethra.  There was some mild trauma from prior Foley placement at the trigone  however thorough cystoscopy revealed no bladder injury or other concerning findings.  The bilateral ureteral orifices were seen to effluxed clear yellow urine.  A sensor wire was used to intubate the right ureteral orifice, and a 5 French access catheter was advanced over the wire just into the ureteral orifice.  A right retrograde pyelogram showed no extravasation, filling defects, or narrowing.  However, there was mild to moderate right-sided hydroureteronephrosis.  In the setting of the hydronephrosis and concern for cautery adjacent to the ureter, I elected to place a right sided ureteral stent.  A 6 French by 26 cm ureteral stent was placed on the right side, however there was a shepherd's hook into the lower pole.  I elected to upsize the stent.  We looked back in the bladder with the rigid cystoscope and the distal end of the stent was grasped and pulled to the meatus.  A sensor wire was used to intubate the stent and was advanced up into the renal pelvis under fluoroscopic vision.  The old stent was removed, and a new 6 [place] by 28 cm ureteral stent was advanced over the wire under fluoroscopic vision and there was an excellent curl noted in the renal pelvis as well as in the bladder.  The patient was returned to the care of Dr. Leafy Ro.  We will plan for follow-up in the operating room in 4 weeks for cystoscopy, stent removal, right retrograde pyelogram.  This was discussed at length with Dr. Leafy Ro and the patient's family.  Disposition: Stable to PACU  Plan: Maintain foley overnight We will arrange follow up cystoscopy, stent removal, and right retrograde pyelogram  in 4-6 weeks in the OR Recommend 0.4mg  tamsulosin nightly to help with stent discomfort Consider Oxybutynin XL 10mg  daily if bladder spasms/severe urgency from stent   Nickolas Madrid, MD

## 2018-05-03 ENCOUNTER — Encounter: Payer: Self-pay | Admitting: Obstetrics and Gynecology

## 2018-05-03 DIAGNOSIS — D259 Leiomyoma of uterus, unspecified: Secondary | ICD-10-CM | POA: Diagnosis not present

## 2018-05-03 LAB — CBC
HCT: 28.3 % — ABNORMAL LOW (ref 36.0–46.0)
Hemoglobin: 9.2 g/dL — ABNORMAL LOW (ref 12.0–15.0)
MCH: 28.9 pg (ref 26.0–34.0)
MCHC: 32.5 g/dL (ref 30.0–36.0)
MCV: 89 fL (ref 80.0–100.0)
Platelets: 225 10*3/uL (ref 150–400)
RBC: 3.18 MIL/uL — ABNORMAL LOW (ref 3.87–5.11)
RDW: 13.6 % (ref 11.5–15.5)
WBC: 9.1 10*3/uL (ref 4.0–10.5)
nRBC: 0 % (ref 0.0–0.2)

## 2018-05-03 LAB — BASIC METABOLIC PANEL
Anion gap: 4 — ABNORMAL LOW (ref 5–15)
BUN: 8 mg/dL (ref 6–20)
CO2: 27 mmol/L (ref 22–32)
Calcium: 7.8 mg/dL — ABNORMAL LOW (ref 8.9–10.3)
Chloride: 109 mmol/L (ref 98–111)
Creatinine, Ser: 0.65 mg/dL (ref 0.44–1.00)
GFR calc Af Amer: >60 mL/min (ref >60)
GFR calc non Af Amer: >60 mL/min (ref >60)
Glucose, Bld: 99 mg/dL (ref 70–99)
Potassium: 3.6 mmol/L (ref 3.5–5.1)
Sodium: 140 mmol/L (ref 135–145)

## 2018-05-03 MED ORDER — CHEWING GUM (ORBIT) SUGAR FREE
1.0000 | CHEWING_GUM | Freq: Four times a day (QID) | ORAL | Status: DC
  Administered 2018-05-03: 1 via ORAL
  Filled 2018-05-03: qty 1

## 2018-05-03 MED ORDER — MELOXICAM 7.5 MG PO TABS: ORAL_TABLET | ORAL | 0 refills | Status: AC

## 2018-05-03 NOTE — Discharge Instructions (Signed)
Here is a helpful article from the website [employer], regarding constipation  Here are reasons why constipation occurs after surgery: 1) During the operation and in the recovery room, most people are given opioid pain medication, primarily through an IV, to treat moderate or severe pain. Intravenous opioids include morphine, Dilaudid and fentanyl. After surgery, patients are often prescribed opioid pain medication to take by mouth at home, including codeine, Vicodin, Norco, and Percocet. All of these medications cause constipation by slowing down the movement of your intestine. 2) Changes in your diet before surgery can be another culprit. It is common to get specific instructions to change how you normally eat or drink before your surgery, like only having liquids the day before or not having anything to eat or drink after midnight the night before surgery. For this reason, temporary dehydration may occur. This, along with not eating or only having liquids, means that you are getting less fiber than usual. Both these factors contribute to constipation. 3) Changes in your diet after surgery can also contribute to the problem. Although many people dont have dietary restrictions after operations, being under anesthesia can make you lose your appetite for several hours and maybe even days. Some people can even have nausea or vomiting. Not eating or drinking normally means that you are not getting enough fiber and you can get dehydrated, both leading to constipation. 4) Lying in a bed more than usual--which happens before, during and after surgery--combined with the medications and diet changes, all work together to slow down your colon and make your poop turn to rock.  No one likes to be constipated.  Lets face it, its not a pleasant feeling when you dont poop for days, then strain on the toilet to finally pass something large enough to cause damage. An ounce of prevention is worth a pound of cure,  so: 1. Assume you will be constipated. 2. Plan and prepare accordingly. Post-surgery is one of those unique situations where the temporary use of laxatives can make a world of difference. Always consult with your doctor, and recognize that if you wait several days after surgery to take a laxative, the constipation might be too severe for these over-the-counter options. It is always important to discuss all medications you plan on taking with your doctor. Ask your doctor if you can start the laxative immediately after surgery. *  Here are go-to post-surgery laxatives: Senna: Senna is an herb that acts as a stimulant laxative, meaning it increases the activity of the intestine to cause you to have a bowel movement. It comes in many forms, but senna pills are easy to take and are sold over the counter at almost all pharmacies. Since opioid pain medications slow down the activity of the intestine, it makes sense to take a medication to help reverse that side effect. Long-term use of a stimulant laxative is not a good idea since it can make your colon lazy and not function properly; however, temporary use immediately after surgery is acceptable. In general, if you are able to eat a normal diet, taking senna soon after surgery works the best. Senna usually works within hours to produce a bowel movement, but this is less predictable when you are taking different medications after surgery. Try not to wait several days to start taking senna, as often it is too late by then. Just like with all medications or supplements, check with your doctor before starting new treatment.   Magnesium: Magnesium is an important mineral that  our body needs. We get magnesium from some foods that we eat, especially foods that are high in fiber such as broccoli, almonds and whole grains. There are also magnesium-based medications used to treat constipation including milk of magnesia (magnesium hydroxide), magnesium citrate and  magnesium oxide. They work by drawing water into the intestine, putting it into the class of osmotic laxatives. Magnesium products in low doses appear to be safe, but if taken in very large doses, can lead to problems such as irregular heartbeat, low blood pressure and even death. It can also affect other medications you might be taking, therefore it is important to discuss using magnesium with your physician and pharmacist before initiating therapy. Most over-the-counter magnesium laxatives work very well to help with the constipation related to surgery, but sometimes they work too well and lead to diarrhea. Make sure you are somewhere with easy access to a bathroom, just in case.   Bisacodyl: Bisacodyl (generic name) is sold under brand names such as Dulcolax. Much like senna, it is a stimulant laxative, meaning it makes your intestines move more quickly to push out the stool. This is another good choice to start taking as soon as your doctor says you can take a laxative after surgery. It comes in pill form and as a suppository, which is a good choice for people who cannot or are not allowed to swallow pills. Studies have shown that it works as a laxative, but like most of these medications, you should use this on a short-term basis only.   Enema: Enemas strike fear in many people, but FEAR NOT! Its nowhere near as big a deal as you may think. An enema is just a way to get some liquid into your rectum by placing a specially designed device through your anus. If you have never done one, it might seem like a painful, unpleasant, uncomfortable, complicated and lengthy procedure. But in reality, its simple, takes just a few seconds and is highly effective. The small ready-made bottles you buy at the pharmacy are much easier than the hose/large rubber container type. Those recommended positions illustrated in some instructions are generally not necessary to place the enema. Its very similar to the insertion  of a tampon, requiring a slight squat. Some extra lubrication on the enemas tip (or on your anus) will make it a breeze. In certain cases, there is no substitute for a good enema. For example, if someone has not pooped for a few days, the beginning of the poop waiting to come out can become rock hard. Passing that hard stool can lead to much pain and problems like anal fissures. Inserting a little liquid to break up the rock-hard stool will help make its passage much easier. Enemas come with different liquids. Most come with saline, but there are also mineral oil options. You can also use warm water in the reusable enema containers. They all work. But since saline can sometimes be irritating, so try a mineral oil or water enema instead.  Here are commonly recommended constipation medications that do not work well for post-surgery constipation: Docusate: Docusate (generic name) most commonly referred to as Colace (brand name) is not really a laxative, but is classified as a stool softener. Although this medication is commonly prescribed, it is not recommended for several reasons: 1) there is no good medical evidence that it works 2) even if it has an effect, which is very questionable, it is minimal and cannot combat the intestinal slowing caused by the  opioid medications. Skip docusate to save money and space in your pillbox for something more effective.  PEG: Miralax (brand name) is basically a chemical called polyethylene glycol (PEG) and it has gained tremendous popularity as a laxative. This product is an osmotic laxative meaning it works by pulling water into the stool, making it softer. This is very similar to the action of natural fiber in foods and supplements. Therefore, the effect seen by this medication is not immediate, causing a bowel movement in a day or more. Is this medication strong enough to battle the constipation related to having an operation? Maybe for some people not prone to  constipation. But for most people, other laxatives are better to prevent constipation after surgery.    Discharge instructions after   total laparoscopic hysterectomy   For the next three days, take ibuprofen and acetaminophen on a schedule, every 8 hours. You can take them together or you can intersperse them, and take one every four hours. I also gave you gabapentin for nighttime, to help you sleep and also to control pain. Take gabapentin medicines at night for at least the next 3 nights. You also have a narcotic, oxycodone, to take as needed if the above medicines don't help.  Postop constipation is a major cause of pain. Stay well hydrated, walk as you tolerate, and take over the counter senna as well as stool softeners if you need them.    Signs and Symptoms to Report Call our office at [phone] if you have any of the following.   Fever over 100.4 degrees or higher  Severe stomach pain not relieved with pain medications  Bright red bleeding thats heavier than a period that does not slow with rest  To go the bathroom a lot (frequency), you cant hold your urine (urgency), or it hurts when you empty your bladder (urinate)  Chest pain  Shortness of breath  Pain in the calves of your legs  Severe nausea and vomiting not relieved with anti-nausea medications  Signs of infection around your wounds, such as redness, hot to touch, swelling, green/yellow drainage (like pus), bad smelling discharge  Any concerns  What You Can Expect after Surgery  You may see some pink tinged, bloody fluid and bruising around the wound. This is normal.  You may notice shoulder and neck pain. This is caused by the gas used during surgery to expand your abdomen so your surgeon could get to the uterus easier.  You may have a sore throat because of the tube in your mouth during general anesthesia. This will go away in 2 to 3 days.  You may have some stomach cramps.  You may notice  spotting on your panties.  You may have pain around the incision sites.   Activities after Your Discharge Follow these guidelines to help speed your recovery at home:  Do the coughing and deep breathing as you did in the hospital for 2 weeks. Use the small blue breathing device, called the incentive spirometer for 2 weeks.  Dont drive if you are in pain or taking narcotic pain medicine. You may drive when you can safely slam on the brakes, turn the wheel forcefully, and rotate your torso comfortably. This is typically 1-2 weeks. Practice in a parking lot or side street prior to attempting to drive regularly.   Ask others to help with household chores for 4 weeks.  Do not lift anything heavier that 10 pounds for 4-6 weeks. This includes pets, children, and  groceries.  Dont do strenuous activities, exercises, or sports like vacuuming, tennis, squash, etc. until your doctor says it is safe to do so. ---Maintain pelvic rest for 8 weeks. This means nothing in the vagina or rectum at all (no douching, tampons, intercourse) for 8 weeks.   Walk as you feel able. Rest often since it may take two or three weeks for your energy level to return to normal.   You may climb stairs  Avoid constipation:   -Eat fruits, vegetables, and whole grains. Eat small meals as your appetite will take time to return to normal.   -Drink 6 to 8 glasses of water each day unless your doctor has told you to limit your fluids.   -Use a laxative or stool softener as needed if constipation becomes a problem. You may take Miralax, metamucil, Citrucil, Colace, Senekot, FiberCon, etc. If this does not relieve the constipation, try two tablespoons of Milk Of Magnesia every 8 hours until your bowels move.   You may shower. Gently wash the wounds with a mild soap and water. Pat dry.  Do not get in a hot tub, swimming pool, etc. for 6 weeks.  Do not use lotions, oils, powders on the wounds.  Do not douche, use tampons, or  have sex until your doctor says it is okay.  Take your pain medicine when you need it. The medicine may not work as well if the pain is bad.  Take the medicines you were taking before surgery. Other medications you will need are pain medications and possibly constipation and nausea medications (Zofran).

## 2018-05-03 NOTE — Discharge Summary (Signed)
1 Day Post-Op       Procedure(s): CYSTOSCOPY WITH URETERAL STENT (RIGHT) AND REMOVAL OF URETERAL STENT (Tracey Mueller) (Right) LAPAROSCOPIC SUPRACERVICAL HYSTERECTOMY (N/A) CYSTOSCOPY WITH RETROGRADE PYELOGRAM/URETERAL STENT PLACEMENT (Right) LAPAROSCOPIC UNILATERAL SALPINGO OOPHORECTOMY (Right) UNILATERAL SALPINGECTOMY (Left) Subjective: The patient is doing well.  No nausea or vomiting. Pain is adequately controlled. Not passing gas yet.  Objective: Vital signs in last 24 hours: Temp:  [98 F (36.7 C)-99.3 F (37.4 C)] 99.3 F (37.4 C) (12/31 0727) Pulse Rate:  [72-100] 76 (12/31 0727) Resp:  [14-20] 18 (12/31 0727) BP: (108-126)/(49-78) 108/58 (12/31 0727) SpO2:  [99 %-100 %] 100 % (12/31 0727)  Intake/Output  Intake/Output Summary (Last 24 hours) at 05/03/2018 0941 Last data filed at 05/03/2018 0751 Gross per 24 hour  Intake 5886.45 ml  Output 4595 ml  Net 1291.45 ml    Physical Exam:  General: Alert and oriented. CV: RRR Lungs: Clear bilaterally. GI: Soft, Nondistended. Incisions: Clean and dry. Urine: Clear Extremities: Nontender, no erythema, no edema.  Lab Results: Recent Labs    05/03/18 0532  HGB 9.2*  HCT 28.3*  WBC 9.1  PLT 225                 Results for orders placed or performed during the hospital encounter of 05/02/18 (from the past 24 hour(s))  CBC     Status: Abnormal   Collection Time: 05/03/18  5:32 AM  Result Value Ref Range   WBC 9.1 4.0 - 10.5 K/uL   RBC 3.18 (L) 3.87 - 5.11 MIL/uL   Hemoglobin 9.2 (L) 12.0 - 15.0 g/dL   HCT 28.3 (L) 36.0 - 46.0 %   MCV 89.0 80.0 - 100.0 fL   MCH 28.9 26.0 - 34.0 pg   MCHC 32.5 30.0 - 36.0 g/dL   RDW 13.6 11.5 - 15.5 %   Platelets 225 150 - 400 K/uL   nRBC 0.0 0.0 - 0.2 %  Basic metabolic panel     Status: Abnormal   Collection Time: 05/03/18  5:32 AM  Result Value Ref Range   Sodium 140 135 - 145 mmol/L   Potassium 3.6 3.5 - 5.1 mmol/L   Chloride 109 98 - 111 mmol/L   CO2 27 22 - 32 mmol/L   Glucose, Bld 99 70 - 99 mg/dL   BUN 8 6 - 20 mg/dL   Creatinine, Ser 0.65 0.44 - 1.00 mg/dL   Calcium 7.8 (L) 8.9 - 10.3 mg/dL   GFR calc non Af Amer >60 >60 mL/min   GFR calc Af Amer >60 >60 mL/min   Anion gap 4 (L) 5 - 15    Assessment/Plan: 1 Day Post-Op       Procedure(s): CYSTOSCOPY WITH URETERAL STENT (RIGHT) AND REMOVAL OF URETERAL STENT (Tracey Mueller) (Right) LAPAROSCOPIC SUPRACERVICAL HYSTERECTOMY (N/A) CYSTOSCOPY WITH RETROGRADE PYELOGRAM/URETERAL STENT PLACEMENT (Right) LAPAROSCOPIC UNILATERAL SALPINGO OOPHORECTOMY (Right) UNILATERAL SALPINGECTOMY (Left)  1) Ambulate, Incentive spirometry 2) Advance diet as tolerated 3) Transition to oral pain medication 4) Acute blood loss anemia: PO iron and vitamin C 5) Discharge home today anticipated    Benjaman Kindler, MD   LOS: 0 days   Benjaman Kindler 05/03/2018, 9:41 AM

## 2018-05-03 NOTE — Progress Notes (Signed)
Discharge order received from doctor. Reviewed discharge instructions and prescriptions with patient and answered all questions. Incision cleaning kit given. Follow up appointments instructions given. Patient verbalized understanding. Patient discharged home via wheelchair by nursing/auxillary.    Abigail Garner, RN  

## 2018-05-05 LAB — SURGICAL PATHOLOGY

## 2018-05-10 ENCOUNTER — Telehealth: Payer: Self-pay | Admitting: Urology

## 2018-05-10 ENCOUNTER — Other Ambulatory Visit: Payer: Self-pay | Admitting: Family Medicine

## 2018-05-10 MED ORDER — OXYBUTYNIN CHLORIDE ER 10 MG PO TB24: 10.0000 mg | ORAL_TABLET | Freq: Every day | ORAL | 0 refills | Status: DC

## 2018-05-10 MED ORDER — TAMSULOSIN HCL 0.4 MG PO CAPS: 0.4000 mg | ORAL_CAPSULE | Freq: Every day | ORAL | 0 refills | Status: DC

## 2018-05-10 NOTE — Telephone Encounter (Signed)
UROLOGY TELEPHONE NOTE  38 year old female status post laparoscopic hysterectomy for large fibroid on 05/02/2018 with Dr. Leafy Ro.  I was called intraoperatively to assess the right ureter for possible injury.  A retrograde pyelogram showed no extravasation or narrowing, however there was mild hydronephrosis.  A right-sided ureteral stent was placed.  Patient is having significant stent related symptoms including urgency, frequency, dysuria, and flank pain.  We discussed these are common stent related symptoms.  Prescribed Flomax 0.4 mg nightly and oxybutynin 10 mg XL daily for stent symptoms.  Follow-up in 3 weeks in the OR for stent removal, right retrograde pyelogram Ultimately will need follow-up renal ultrasound in 2 to 3 months  Nickolas Madrid, MD 05/10/2018

## 2018-05-12 ENCOUNTER — Telehealth: Payer: Self-pay | Admitting: Radiology

## 2018-05-12 ENCOUNTER — Other Ambulatory Visit: Payer: Self-pay | Admitting: Radiology

## 2018-05-12 DIAGNOSIS — S3710XD Unspecified injury of ureter, subsequent encounter: Secondary | ICD-10-CM

## 2018-05-12 NOTE — Telephone Encounter (Signed)
Discussed the [facility] Information form below with patient over the phone.  [address], [place] [address] Telephone: [phone] Fax: [phone]   Thank you for choosing [facility] for your upcoming surgery!  We are always here to assist in your urological needs.  Please read the following information with specific details for your upcoming appointments related to your surgery. Please contact Tamari Busic at [phone] Option 3 with any questions.  The Name of Your Surgery: cystoscopy, right retrograde pyelogram, ureteral stent removal Your Surgery Date: 05/20/2018 Your Surgeon: Nickolas Madrid  Please call Same Day Surgery at [phone] between the hours of 1pm-3pm one day prior to your surgery. They will inform you of the time to arrive at Same Day Surgery which is located on the second floor of the [facility].  You will receive a call from the [place] office regarding your appointment with them.  The Pre-Admission Testing office is located at [address], on the first floor of the [facility] in [place] (office is to the right as you enter through the [employer] of the [facility]). Please have all medications you are currently taking and your insurance card available.   Patient was advised to have nothing to eat or drink after midnight the night prior to surgery except that she may have only water until 2 hours before surgery with nothing to drink within 2 hours of surgery.  The patient states she currently takes no blood thinners. Patient's questions were answered and she expressed understanding of these instructions.

## 2018-05-13 ENCOUNTER — Other Ambulatory Visit: Payer: 59

## 2018-05-13 DIAGNOSIS — S3710XD Unspecified injury of ureter, subsequent encounter: Secondary | ICD-10-CM

## 2018-05-13 LAB — URINALYSIS, COMPLETE
Bilirubin, UA: NEGATIVE
GLUCOSE, UA: NEGATIVE
Ketones, UA: NEGATIVE
Nitrite, UA: NEGATIVE
Protein, UA: NEGATIVE
Specific Gravity, UA: 1.02 (ref 1.005–1.030)
Urobilinogen, Ur: 0.2 mg/dL (ref 0.2–1.0)
pH, UA: 6 (ref 5.0–7.5)

## 2018-05-13 LAB — MICROSCOPIC EXAMINATION: Bacteria, UA: NONE SEEN

## 2018-05-17 ENCOUNTER — Encounter
Admission: RE | Admit: 2018-05-17 | Discharge: 2018-05-17 | Disposition: A | Discharge status: Home / Self Care | Payer: 59 | Source: Ambulatory Visit | Attending: Urology | Admitting: Urology

## 2018-05-17 ENCOUNTER — Other Ambulatory Visit: Payer: Self-pay

## 2018-05-17 ENCOUNTER — Encounter: Payer: Self-pay | Admitting: *Deleted

## 2018-05-17 NOTE — Patient Instructions (Signed)
Your procedure is scheduled on: 05/20/18 Fri Report to Same Day Surgery 2nd floor medical mall [facility] Entrance-take elevator on left to 2nd floor.  Check in with surgery information desk.) To find out your arrival time please call [phone] between 1PM - 3PM on 05/19/18 Thurs  Remember: Instructions that are not followed completely may result in serious medical risk, up to and including death, or upon the discretion of your surgeon and anesthesiologist your surgery may need to be rescheduled.    _x___ 1. Do not eat food after midnight the night before your procedure. You may drink clear liquids up to 2 hours before you are scheduled to arrive at the hospital for your procedure.  Do not drink clear liquids within 2 hours of your scheduled arrival to the hospital.  Clear liquids include  --Water or Apple juice without pulp  --Clear carbohydrate beverage such as ClearFast or Gatorade  --Black Coffee or Clear Tea (No milk, no creamers, do not add anything to                  the coffee or Tea Type 1 and type 2 diabetics should only drink water.   ____Ensure clear carbohydrate drink on the way to the hospital for bariatric patients  ____Ensure clear carbohydrate drink 3 hours before surgery for Dr Dwyane Luo patients if physician instructed.   No gum chewing or hard candies.     __x__ 2. No Alcohol for 24 hours before or after surgery.   __x__3. No Smoking or e-cigarettes for 24 prior to surgery.  Do not use any chewable tobacco products for at least 6 hour prior to surgery   ____  4. Bring all medications with you on the day of surgery if instructed.    __x__ 5. Notify your doctor if there is any change in your medical condition     (cold, fever, infections).    x___6. On the morning of surgery brush your teeth with toothpaste and water.  You may rinse your mouth with mouth wash if you wish.  Do not swallow any toothpaste or mouthwash.   Do not wear jewelry, make-up, hairpins,  clips or nail polish.  Do not wear lotions, powders, or perfumes. You may wear deodorant.  Do not shave 48 hours prior to surgery. Men may shave face and neck.  Do not bring valuables to the hospital.    [facility] is not responsible for any belongings or valuables.               Contacts, dentures or bridgework may not be worn into surgery.  Leave your suitcase in the car. After surgery it may be brought to your room.  For patients admitted to the hospital, discharge time is determined by your                       treatment team.  _  Patients discharged the day of surgery will not be allowed to drive home.  You will need someone to drive you home and stay with you the night of your procedure.    Please read over the following fact sheets that you were given:   [facility] Preparing for Surgery and or MRSA Information   _x___ Take anti-hypertensive listed below, cardiac, seizure, asthma,     anti-reflux and psychiatric medicines. These include:  1. oxybutynin (DITROPAN-XL) 10 MG 24 hr tablet  2.tamsulosin (FLOMAX) 0.4 MG CAPS capsule  3.  4.  5.  6.  ____Fleets enema or Magnesium Citrate as directed.   ____ Use CHG Soap or sage wipes as directed on instruction sheet   ____ Use inhalers on the day of surgery and bring to hospital day of surgery  ____ Stop Metformin and Janumet 2 days prior to surgery.    ____ Take 1/2 of usual insulin dose the night before surgery and none on the morning     surgery.   _x___ Follow recommendations from Cardiologist, Pulmonologist or PCP regarding          stopping Aspirin, Coumadin, Plavix ,Eliquis, Effient, or Pradaxa, and Pletal.  X____Stop Anti-inflammatories such as Advil, Aleve, Ibuprofen, Motrin, Naproxen, Naprosyn, Goodies powders or aspirin products. OK to take Tylenol and                          Celebrex.   _x___ Stop supplements until after surgery.  But may continue Vitamin D, Vitamin B,       and multivitamin.   ____ Bring C-Pap  to the hospital.

## 2018-05-18 LAB — CULTURE, URINE COMPREHENSIVE

## 2018-05-19 MED ORDER — CEFAZOLIN SODIUM-DEXTROSE 2-4 GM/100ML-% IV SOLN
2.0000 g | INTRAVENOUS | Status: AC
  Administered 2018-05-20: 2 g via INTRAVENOUS

## 2018-05-20 ENCOUNTER — Ambulatory Visit
Admission: RE | Admit: 2018-05-20 | Discharge: 2018-05-20 | Disposition: A | Discharge status: Home / Self Care | Payer: 59 | Attending: Urology | Admitting: Urology

## 2018-05-20 ENCOUNTER — Other Ambulatory Visit: Payer: Self-pay

## 2018-05-20 ENCOUNTER — Ambulatory Visit: Payer: 59 | Admitting: Anesthesiology

## 2018-05-20 ENCOUNTER — Encounter
Admission: RE | Disposition: A | Discharge status: Home / Self Care | Payer: Self-pay | Source: Home / Self Care | Attending: Urology

## 2018-05-20 ENCOUNTER — Telehealth: Payer: Self-pay | Admitting: Urology

## 2018-05-20 ENCOUNTER — Encounter: Payer: Self-pay | Admitting: *Deleted

## 2018-05-20 DIAGNOSIS — N329 Bladder disorder, unspecified: Secondary | ICD-10-CM | POA: Insufficient documentation

## 2018-05-20 DIAGNOSIS — Z466 Encounter for fitting and adjustment of urinary device: Secondary | ICD-10-CM | POA: Diagnosis present

## 2018-05-20 DIAGNOSIS — S3710XD Unspecified injury of ureter, subsequent encounter: Secondary | ICD-10-CM

## 2018-05-20 HISTORY — PX: CYSTOSCOPY W/ URETERAL STENT REMOVAL: SHX1430

## 2018-05-20 HISTORY — PX: CYSTOGRAM: SHX6285

## 2018-05-20 HISTORY — PX: CYSTOSCOPY W/ RETROGRADES: SHX1426

## 2018-05-20 SURGERY — REMOVAL, STENT, URETER, CYSTOSCOPIC
Anesthesia: General | Laterality: Right

## 2018-05-20 MED ORDER — DEXAMETHASONE SODIUM PHOSPHATE 10 MG/ML IJ SOLN
INTRAMUSCULAR | Status: DC | PRN
  Administered 2018-05-20: 10 mg via INTRAVENOUS

## 2018-05-20 MED ORDER — FENTANYL CITRATE (PF) 100 MCG/2ML IJ SOLN
25.0000 ug | INTRAMUSCULAR | Status: DC | PRN
  Administered 2018-05-20 (×2): 25 ug via INTRAVENOUS

## 2018-05-20 MED ORDER — FAMOTIDINE 20 MG PO TABS
ORAL_TABLET | ORAL | Status: AC
  Administered 2018-05-20: 20 mg via ORAL
  Filled 2018-05-20: qty 1

## 2018-05-20 MED ORDER — LACTATED RINGERS IV SOLN: INTRAVENOUS | Status: DC

## 2018-05-20 MED ORDER — SCOPOLAMINE 1 MG/3DAYS TD PT72
MEDICATED_PATCH | TRANSDERMAL | Status: AC
  Filled 2018-05-20: qty 1

## 2018-05-20 MED ORDER — DEXAMETHASONE SODIUM PHOSPHATE 10 MG/ML IJ SOLN
INTRAMUSCULAR | Status: AC
  Filled 2018-05-20: qty 1

## 2018-05-20 MED ORDER — PROPOFOL 10 MG/ML IV BOLUS
INTRAVENOUS | Status: AC
  Filled 2018-05-20: qty 20

## 2018-05-20 MED ORDER — IOPAMIDOL (ISOVUE-200) INJECTION 41%
INTRAVENOUS | Status: DC | PRN
  Administered 2018-05-20: 150 mL

## 2018-05-20 MED ORDER — FENTANYL CITRATE (PF) 100 MCG/2ML IJ SOLN
INTRAMUSCULAR | Status: AC
  Administered 2018-05-20: 25 ug via INTRAVENOUS
  Filled 2018-05-20: qty 2

## 2018-05-20 MED ORDER — MIDAZOLAM HCL 2 MG/2ML IJ SOLN
INTRAMUSCULAR | Status: DC | PRN
  Administered 2018-05-20: 2 mg via INTRAVENOUS

## 2018-05-20 MED ORDER — FENTANYL CITRATE (PF) 100 MCG/2ML IJ SOLN
INTRAMUSCULAR | Status: AC
  Filled 2018-05-20: qty 2

## 2018-05-20 MED ORDER — ONDANSETRON HCL 4 MG/2ML IJ SOLN: 4.0000 mg | Freq: Once | INTRAMUSCULAR | Status: DC | PRN

## 2018-05-20 MED ORDER — LACTATED RINGERS IV SOLN
INTRAVENOUS | Status: DC | PRN
  Administered 2018-05-20: 10:00:00 via INTRAVENOUS

## 2018-05-20 MED ORDER — FENTANYL CITRATE (PF) 100 MCG/2ML IJ SOLN
INTRAMUSCULAR | Status: DC | PRN
  Administered 2018-05-20 (×2): 50 ug via INTRAVENOUS

## 2018-05-20 MED ORDER — MIDAZOLAM HCL 2 MG/2ML IJ SOLN
INTRAMUSCULAR | Status: AC
  Filled 2018-05-20: qty 2

## 2018-05-20 MED ORDER — LIDOCAINE HCL (CARDIAC) PF 100 MG/5ML IV SOSY
PREFILLED_SYRINGE | INTRAVENOUS | Status: DC | PRN
  Administered 2018-05-20: 60 mg via INTRAVENOUS

## 2018-05-20 MED ORDER — SCOPOLAMINE 1 MG/3DAYS TD PT72
1.0000 | MEDICATED_PATCH | TRANSDERMAL | Status: DC
  Administered 2018-05-20: 1.5 mg via TRANSDERMAL

## 2018-05-20 MED ORDER — PROPOFOL 10 MG/ML IV BOLUS
INTRAVENOUS | Status: DC | PRN
  Administered 2018-05-20: 150 mg via INTRAVENOUS

## 2018-05-20 MED ORDER — FAMOTIDINE 20 MG PO TABS
20.0000 mg | ORAL_TABLET | Freq: Once | ORAL | Status: AC
  Administered 2018-05-20: 20 mg via ORAL

## 2018-05-20 MED ORDER — ONDANSETRON HCL 4 MG/2ML IJ SOLN
INTRAMUSCULAR | Status: DC | PRN
  Administered 2018-05-20: 4 mg via INTRAVENOUS

## 2018-05-20 MED ORDER — ONDANSETRON HCL 4 MG/2ML IJ SOLN
INTRAMUSCULAR | Status: AC
  Filled 2018-05-20: qty 2

## 2018-05-20 MED ORDER — CEFAZOLIN SODIUM-DEXTROSE 2-4 GM/100ML-% IV SOLN
INTRAVENOUS | Status: AC
  Filled 2018-05-20: qty 100

## 2018-05-20 SURGICAL SUPPLY — 22 items
BAG DRAIN CYSTO-URO LG1000N (MISCELLANEOUS) ×4 IMPLANT
BRUSH SCRUB EZ  4% CHG (MISCELLANEOUS) ×2
BRUSH SCRUB EZ 4% CHG (MISCELLANEOUS) ×2 IMPLANT
CATH URETL 5X70 OPEN END (CATHETERS) ×4 IMPLANT
CONRAY 43 FOR UROLOGY 50M (MISCELLANEOUS) ×4 IMPLANT
DRAPE UTILITY 15X26 TOWEL STRL (DRAPES) ×4 IMPLANT
GLOVE BIOGEL PI IND STRL 7.5 (GLOVE) ×2 IMPLANT
GLOVE BIOGEL PI INDICATOR 7.5 (GLOVE) ×2
GOWN STRL REUS W/ TWL LRG LVL3 (GOWN DISPOSABLE) ×2 IMPLANT
GOWN STRL REUS W/ TWL XL LVL3 (GOWN DISPOSABLE) ×2 IMPLANT
GOWN STRL REUS W/TWL LRG LVL3 (GOWN DISPOSABLE) ×2
GOWN STRL REUS W/TWL XL LVL3 (GOWN DISPOSABLE) ×2
KIT TURNOVER CYSTO (KITS) ×4 IMPLANT
PACK CYSTO AR (MISCELLANEOUS) ×4 IMPLANT
SENSORWIRE 0.038 NOT ANGLED (WIRE) ×4
SET CYSTO W/LG BORE CLAMP LF (SET/KITS/TRAYS/PACK) ×4 IMPLANT
SOL .9 NS 3000ML IRR  AL (IV SOLUTION) ×2
SOL .9 NS 3000ML IRR UROMATIC (IV SOLUTION) ×2 IMPLANT
SURGILUBE 2OZ TUBE FLIPTOP (MISCELLANEOUS) ×4 IMPLANT
SYRINGE IRR TOOMEY STRL 70CC (SYRINGE) ×2 IMPLANT
WATER STERILE IRR 1000ML POUR (IV SOLUTION) ×4 IMPLANT
WIRE SENSOR 0.038 NOT ANGLED (WIRE) ×2 IMPLANT

## 2018-05-20 NOTE — Telephone Encounter (Signed)
App has been made Scheduling will contact pt for [employer]

## 2018-05-20 NOTE — Discharge Instructions (Signed)

## 2018-05-20 NOTE — Anesthesia Procedure Notes (Signed)
Procedure Name: LMA Insertion Date/Time: 05/20/2018 10:23 AM Performed by: Justus Memory, CRNA Pre-anesthesia Checklist: Patient identified, Patient being monitored, Timeout performed, Emergency Drugs available and Suction available Patient Re-evaluated:Patient Re-evaluated prior to induction Oxygen Delivery Method: Circle system utilized Preoxygenation: Pre-oxygenation with 100% oxygen Induction Type: IV induction Ventilation: Mask ventilation without difficulty LMA: LMA inserted LMA Size: 3.5 Tube type: Oral Number of attempts: 1 Placement Confirmation: positive ETCO2 and breath sounds checked- equal and bilateral Tube secured with: Tape Dental Injury: Teeth and Oropharynx as per pre-operative assessment

## 2018-05-20 NOTE — Anesthesia Post-op Follow-up Note (Signed)
Anesthesia QCDR form completed.        

## 2018-05-20 NOTE — Transfer of Care (Signed)
Immediate Anesthesia Transfer of Care Note  Patient: Tracey Mueller  Procedure(s) Performed: CYSTOSCOPY WITH STENT REMOVAL (Right ) CYSTOSCOPY WITH RETROGRADE PYELOGRAM (Right ) CYSTOGRAM  Patient Location: PACU  Anesthesia Type:General  Level of Consciousness: sedated  Airway & Oxygen Therapy: Patient Spontanous Breathing and Patient connected to face mask oxygen  Post-op Assessment: Report given to RN and Post -op Vital signs reviewed and stable  Post vital signs: Reviewed and stable  Last Vitals:  Vitals Value Taken Time  BP 124/69 05/20/2018 11:00 AM  Temp 36.7 C 05/20/2018 11:00 AM  Pulse 110 05/20/2018 11:01 AM  Resp 16 05/20/2018 11:01 AM  SpO2 96 % 05/20/2018 11:01 AM  Vitals shown include unvalidated device data.  Last Pain:  Vitals:   05/20/18 1100  TempSrc:   PainSc: 0-No pain         Complications: No apparent anesthesia complications

## 2018-05-20 NOTE — Op Note (Signed)
Date of procedure: 05/20/18  Preoperative diagnosis:  1. Possible right ureteral injury  Postoperative diagnosis:  1. Same  Procedure: 1. Cystoscopy, right stent removal, right retrograde pyelogram, cystogram  Surgeon: Nickolas Madrid, MD  Anesthesia: General  Complications: None  Intraoperative findings:  1.  Cystoscopy notable for 1 cm patch of fibrinous material consistent with scar at the posterior bladder wall 2.  Right retrograde pyelogram with no extravasation or narrowing 3.  Excellent drainage of right collecting system, and E flux from the right ureter 4.  Cystogram with no bladder injury  EBL: None  Specimens: None  Drains: None  Indication: Tracey Mueller is a 38 y.o. patient that previously underwent laparoscopic hysterectomy for very large fibroid with Dr. Leafy Ro on 05/02/2018.  Intraoperatively there was concern for possible cautery injury adjacent to the right ureter and I was asked to perform a retrograde pyelogram and stent placement.  Intraoperatively, there was no extravasation or distinct stricture, but there was mild hydronephrosis and a ureteral stent was placed.  She presents today for evaluation of the right ureter with retrograde pyelogram and possible stent removal versus replacement.  After reviewing the management options for treatment, they elected to proceed with the above surgical procedure(s). We have discussed the potential benefits and risks of the procedure, side effects of the proposed treatment, the likelihood of the patient achieving the goals of the procedure, and any potential problems that might occur during the procedure or recuperation. Informed consent has been obtained.  Description of procedure:  The patient was taken to the operating room and general anesthesia was induced.  The patient was placed in the dorsal lithotomy position, prepped and draped in the usual sterile fashion, and preoperative antibiotics were administered. A  preoperative time-out was performed.   The 21 French rigid cystoscope was used to intubate the urethra.  I immediately identified a 1 cm patch of fibrinous material consistent with scar and/or cautery injury at the posterior bladder wall.  This was not present at time of original stent placement.  I then turned my attention to the right ureteral stent and this was removed in its entirety.  A right retrograde pyelogram was performed using a 5 French access catheter and no extravasation or narrowing was noted.  There was mild dilation of the mid ureter consistent with previous stent placement, but no distinct hydronephrosis.  There was an immediate efflux of clear urine and contrast from the right ureteral orifice.  Fluoroscopy 5 minutes later showed excellent drainage of the contrast from the right collecting system.  I elected to perform a cystogram in the setting of the posterior wall findings consistent with a prior bladder injury.  An 60 French Foley was placed in the bladder and 10 cc placed in the balloon.  300 cc of diluted contrast were injected into the bladder and plain film revealed no injury or leakage.  Oblique films and post drainage films were also obtained.  The catheter was removed and this concluded our procedure  Disposition: Stable to PACU  Plan: Follow-up in 4 to 6 weeks with renal ultrasound to rule out silent hydronephrosis  Nickolas Madrid, MD

## 2018-05-20 NOTE — Anesthesia Preprocedure Evaluation (Signed)
Anesthesia Evaluation  Patient identified by MRN, date of birth, ID band Patient awake    Reviewed: Allergy & Precautions, H&P , NPO status , Patient's Chart, lab work & pertinent test results, reviewed documented beta blocker date and time   History of Anesthesia Complications (+) PONV and history of anesthetic complications  Airway Mallampati: II  TM Distance: >3 FB Neck ROM: full    Dental  (+) Teeth Intact   Pulmonary neg pulmonary ROS,    Pulmonary exam normal        Cardiovascular Exercise Tolerance: Good negative cardio ROS Normal cardiovascular exam Rate:Normal     Neuro/Psych negative neurological ROS  negative psych ROS   GI/Hepatic negative GI ROS, Neg liver ROS,   Endo/Other  negative endocrine ROS  Renal/GU negative Renal ROS  negative genitourinary   Musculoskeletal   Abdominal   Peds  Hematology negative hematology ROS (+)   Anesthesia Other Findings   Reproductive/Obstetrics negative OB ROS                             Anesthesia Physical Anesthesia Plan  ASA: II  Anesthesia Plan: General LMA   Post-op Pain Management:    Induction:   PONV Risk Score and Plan:   Airway Management Planned:   Additional Equipment:   Intra-op Plan:   Post-operative Plan:   Informed Consent: I have reviewed the patients History and Physical, chart, labs and discussed the procedure including the risks, benefits and alternatives for the proposed anesthesia with the patient or authorized representative who has indicated his/her understanding and acceptance.       Plan Discussed with: CRNA  Anesthesia Plan Comments:         Anesthesia Quick Evaluation

## 2018-05-20 NOTE — Telephone Encounter (Signed)
-----   Message from Billey Co, MD sent at 05/20/2018 11:07 AM EST ----- Regarding: follow up Please schedule follow up in 6 weeks with renal ultrasound.  Thanks Nickolas Madrid, MD 05/20/2018

## 2018-05-20 NOTE — H&P (Signed)
   Tracey Mueller February 12, 1981 [id number]  CC: History of possible right ureteral injury, s/p right stent placement  HPI: Tracey Mueller is a 38 year old female here today for right retrograde pyelogram, possible ureteral stent exchange, possible stent removal.  She underwent laparoscopic hysterectomy for a large fibroid with Dr. Leafy Ro on 05/02/2018 and there was a concern for possible right ureteral cautery injury.  I performed cystoscopy and retrograde pyelogram at that time that showed no extravasation or obvious injury, and a stent was placed.  The patient has not tolerated the stent very well with significant urinary urgency, frequency, and dysuria.  She presents today for stent removal, retrograde pyelogram, possible stent removal versus exchange.  She denies any fevers or chills.  Denies chest pain or shortness of breath.   PMH: Past Medical History:  Diagnosis Date  . PONV (postoperative nausea and vomiting)   . Uterine fibroids in pregnancy, postpartum condition     Surgical History: Past Surgical History:  Procedure Laterality Date  . BREAST SURGERY  2009   Implants  . CYSTOSCOPY Right 05/02/2018   Procedure: CYSTOSCOPY WITH URETERAL STENT (RIGHT) AND REMOVAL OF URETERAL STENT (BEASLEY);  Surgeon: Benjaman Kindler, MD;  Location: [facility] ORS;  Service: Gynecology;  Laterality: Right;  . CYSTOSCOPY W/ URETERAL STENT PLACEMENT Right 05/02/2018   Procedure: CYSTOSCOPY WITH RETROGRADE PYELOGRAM/URETERAL STENT PLACEMENT;  Surgeon: Billey Co, MD;  Location: [facility] ORS;  Service: Urology;  Laterality: Right;  . LAPAROSCOPIC SUPRACERVICAL HYSTERECTOMY N/A 05/02/2018   Procedure: LAPAROSCOPIC SUPRACERVICAL HYSTERECTOMY;  Surgeon: Benjaman Kindler, MD;  Location: [facility] ORS;  Service: Gynecology;  Laterality: N/A;  . LAPAROSCOPIC UNILATERAL SALPINGO OOPHERECTOMY Right 05/02/2018   Procedure: LAPAROSCOPIC UNILATERAL SALPINGO OOPHORECTOMY;  Surgeon: Benjaman Kindler, MD;  Location: [facility]  ORS;  Service: Gynecology;  Laterality: Right;  . TUBAL LIGATION  2011  . UNILATERAL SALPINGECTOMY Left 05/02/2018   Procedure: UNILATERAL SALPINGECTOMY;  Surgeon: Benjaman Kindler, MD;  Location: [facility] ORS;  Service: Gynecology;  Laterality: Left;     Allergies: No Known Allergies  Family History: History reviewed. No pertinent family history.  Social History:  reports that she has never smoked. She has quit using smokeless tobacco. She reports that she does not drink alcohol or use drugs.  ROS: Negative aside from those stated in the HPI   Physical Exam: LMP 04/04/2018    Constitutional:  Alert and oriented, No acute distress. Cardiovascular: Regular rate and rhythm Respiratory: Clear to auscultation bilaterally GI: Abdomen is soft, nontender, nondistended, no abdominal masses Lymph: No cervical or inguinal lymphadenopathy. Skin: No rashes, bruises or suspicious lesions. Neurologic: Grossly intact, no focal deficits, moving all 4 extremities. Psychiatric: Normal mood and affect.  Laboratory Data: Urine culture 05/13/2018 no growth  Assessment & Plan:   In summary, the patient is a 38 year old female status post laparoscopic hysterectomy 05/02/2018 with possible right ureteral cautery injury.  A retrograde pyelogram at that time showed no obvious extravasation or injury, and a right ureteral stent was placed as a precaution.  She presents today for stent removal, retrograde pyelogram, possible stent removal versus exchange.  We discussed the risks and benefits at length including bleeding, infection, delayed presentation of cautery injury, renal colic, and need for follow-up ultrasound in 4 to 6 weeks.  Billey Co, [facility] [address] [place], [place] [address] [phone]

## 2018-05-24 ENCOUNTER — Telehealth: Payer: Self-pay | Admitting: Urology

## 2018-05-24 NOTE — Telephone Encounter (Signed)
Patient had surgery and after having her stent removed is still having some pain in her kidney, she is wondering if this is normal? Please call her back    [place]

## 2018-05-26 NOTE — Anesthesia Postprocedure Evaluation (Signed)
Anesthesia Post Note  Patient: Tracey Mueller  Procedure(s) Performed: CYSTOSCOPY WITH STENT REMOVAL (Right ) CYSTOSCOPY WITH RETROGRADE PYELOGRAM (Right ) CYSTOGRAM  Patient location during evaluation: PACU Anesthesia Type: General Level of consciousness: awake and alert Pain management: pain level controlled Vital Signs Assessment: post-procedure vital signs reviewed and stable Respiratory status: spontaneous breathing, nonlabored ventilation, respiratory function stable and patient connected to nasal cannula oxygen Cardiovascular status: blood pressure returned to baseline and stable Postop Assessment: no apparent nausea or vomiting Anesthetic complications: no     Last Vitals:  Vitals:   05/20/18 1153 05/20/18 1216  BP: (!) 116/53 126/64  Pulse: 72 68  Resp: 16   Temp: 36.4 C   SpO2: 99%     Last Pain:  Vitals:   05/23/18 0757  TempSrc:   PainSc: 5                  Molli Barrows

## 2018-05-26 NOTE — Telephone Encounter (Signed)
Normal to have some intermittent discomfort on that side for 7-10 days, but should continue to improve. Ibuprofen or aleve usually works best.  If not improving we need to see her sooner with the renal ultrasound instead of in a month.  Nickolas Madrid, MD 05/26/2018

## 2018-05-27 NOTE — Telephone Encounter (Signed)
LMOM for patient to return call.

## 2018-05-30 NOTE — Telephone Encounter (Signed)
LMOM for patient to return call.

## 2018-06-17 ENCOUNTER — Ambulatory Visit
Admission: RE | Admit: 2018-06-17 | Discharge: 2018-06-17 | Disposition: A | Discharge status: Home / Self Care | Payer: 59 | Source: Ambulatory Visit | Attending: Urology | Admitting: Urology

## 2018-06-17 DIAGNOSIS — S3710XD Unspecified injury of ureter, subsequent encounter: Secondary | ICD-10-CM

## 2018-06-20 ENCOUNTER — Encounter: Payer: Self-pay | Admitting: Urology

## 2018-06-20 ENCOUNTER — Ambulatory Visit (INDEPENDENT_AMBULATORY_CARE_PROVIDER_SITE_OTHER): Payer: 59 | Admitting: Urology

## 2018-06-20 VITALS — BP 109/64 | HR 76 | Ht 69.0 in | Wt 157.0 lb

## 2018-06-20 DIAGNOSIS — R102 Pelvic and perineal pain: Secondary | ICD-10-CM | POA: Insufficient documentation

## 2018-06-20 DIAGNOSIS — R300 Dysuria: Secondary | ICD-10-CM | POA: Diagnosis not present

## 2018-06-20 DIAGNOSIS — S3710XD Unspecified injury of ureter, subsequent encounter: Secondary | ICD-10-CM | POA: Diagnosis not present

## 2018-06-20 MED ORDER — FLUCONAZOLE 100 MG PO TABS: 100.0000 mg | ORAL_TABLET | Freq: Every day | ORAL | 0 refills | Status: DC

## 2018-06-20 MED ORDER — SULFAMETHOXAZOLE-TRIMETHOPRIM 800-160 MG PO TABS: 1.0000 | ORAL_TABLET | Freq: Two times a day (BID) | ORAL | 0 refills | Status: DC

## 2018-06-20 NOTE — Patient Instructions (Signed)

## 2018-06-20 NOTE — Progress Notes (Signed)
   06/20/2018 4:35 PM   [name] May 24, 1980 [id number]  Reason for visit: Follow up possible right ureteral injury  HPI: I saw Ms. Tracey Mueller in urology clinic today in follow-up.  She is a healthy 38 year old female that underwent a laparoscopic hysterectomy for very large fibroid with Dr. Leafy Ro on 05/02/2018.  Intraoperatively I was consulted for possible cautery injury to the right ureter.  I performed a retrograde pyelogram at that time that showed no obvious extravasation or stricture, however there is mild hydronephrosis and a ureteral stent was placed.  She did not tolerate the stent well secondary to significant irritative symptoms, and this was removed on 05/20/2018.  This was performed in the operating room, and retrograde pyelogram after stent removal was normal.  However, there was a 1 cm patch of fibrinous material at the posterior bladder wall consistent with scar, likely secondary to cautery injury from the hysterectomy.  I additionally performed a cystogram which showed no extravasation of contrast.  Since stent removal, she has been doing well and denies any right-sided flank pain.  Her follow-up renal ultrasound showed no evidence of hydronephrosis.  She has had urinary tract infection type symptoms over the last few weeks intermittently, with a culture positive E. coli UTI on 06/08/2018.  These primarily consist of foul-smelling urine and dysuria.  This resolved with Bactrim antibiotics for 5 days, however she has had a recurrence of her symptoms the last few days.  Urinalysis today is concerning for infection with 11-30 WBCs, and bacteria.  We long discussion today about her symptoms.  I suspect this fibrinous area of scar in the posterior bladder wall is causing re-infection without an extended course of antibiotics.  I recommended a 2-week course of treatment Bactrim, with follow-up in 1 month for symptom check.  Low suspicion for bladder injury or perforation, she is not  having any vaginal leakage, abdominal pain, or other symptoms.  RTC 4 weeks with PVR  A total of 15 minutes were spent face-to-face with the patient, greater than 50% was spent in patient education, counseling, and coordination of care regarding history of right ureteral stent placement, postop findings, and recurrent UTI.   Billey Co, [facility] [address], [place], [place] [address] [phone]

## 2018-06-21 ENCOUNTER — Telehealth: Payer: Self-pay | Admitting: Urology

## 2018-06-21 LAB — MICROSCOPIC EXAMINATION: RBC, UA: NONE SEEN /hpf (ref 0–2)

## 2018-06-21 LAB — URINALYSIS, COMPLETE
Bilirubin, UA: NEGATIVE
Glucose, UA: NEGATIVE
Ketones, UA: NEGATIVE
Nitrite, UA: NEGATIVE
Protein, UA: NEGATIVE
Specific Gravity, UA: 1.01 (ref 1.005–1.030)
Urobilinogen, Ur: 0.2 mg/dL (ref 0.2–1.0)
pH, UA: 6 (ref 5.0–7.5)

## 2018-06-21 MED ORDER — SULFAMETHOXAZOLE-TRIMETHOPRIM 800-160 MG PO TABS: 1.0000 | ORAL_TABLET | Freq: Two times a day (BID) | ORAL | 0 refills | Status: AC

## 2018-06-21 MED ORDER — FLUCONAZOLE 100 MG PO TABS: 100.0000 mg | ORAL_TABLET | Freq: Every day | ORAL | 0 refills | Status: AC

## 2018-06-21 NOTE — Telephone Encounter (Signed)
Spoke to patient and informed her the medication was sent to pharmacy. She states they did not get the RX. I resent the medication to pharmacy for patient to pick up.

## 2018-06-21 NOTE — Telephone Encounter (Signed)
Pt called and states that her prescriptions (Bactrim and Diflucan) were not called into [facility] yesterday and we had the wrong pharmacy on file. [facility] [place]

## 2018-06-22 LAB — CULTURE, URINE COMPREHENSIVE

## 2018-08-02 ENCOUNTER — Telehealth (INDEPENDENT_AMBULATORY_CARE_PROVIDER_SITE_OTHER): Payer: 59 | Admitting: Urology

## 2018-08-02 ENCOUNTER — Other Ambulatory Visit: Payer: 59

## 2018-08-02 ENCOUNTER — Telehealth: Payer: Self-pay | Admitting: Urology

## 2018-08-02 ENCOUNTER — Other Ambulatory Visit: Payer: Self-pay

## 2018-08-02 DIAGNOSIS — N390 Urinary tract infection, site not specified: Secondary | ICD-10-CM | POA: Diagnosis not present

## 2018-08-02 MED ORDER — SULFAMETHOXAZOLE-TRIMETHOPRIM 800-160 MG PO TABS: 1.0000 | ORAL_TABLET | Freq: Two times a day (BID) | ORAL | 0 refills | Status: AC

## 2018-08-02 MED ORDER — SULFAMETHOXAZOLE-TRIMETHOPRIM 800-160 MG PO TABS: 1.0000 | ORAL_TABLET | Freq: Every day | ORAL | 11 refills | Status: AC | PRN

## 2018-08-02 NOTE — Patient Instructions (Signed)

## 2018-08-02 NOTE — Progress Notes (Signed)
Virtual Visit via Telephone Note  I connected with Tracey Mueller on 08/02/18 at  9:30 AM EDT by telephone and verified that I am speaking with the correct person using two identifiers.   I discussed the limitations, risks, security and privacy concerns of performing an evaluation and management service by telephone and the availability of in person appointments. We discussed the impact of the COVID-19 on the healthcare system, and the importance of social distancing and reducing patient and provider exposure. I also discussed with the patient that there may be a patient responsible charge related to this service. The patient expressed understanding and agreed to proceed.  Reason for visit: History of possible right ureteral injury during hysterectomy, with intraoperative right ureteral stent placement and removal after 2 weeks.  Recurrent UTIs.  History of Present Illness: Briefly, Tracey Mueller is a healthy 38 year old female that underwent laparoscopic hysterectomy for large fibroid with Dr. Leafy Ro on 05/02/2018.  This was complicated by possible right ureteral cautery injury.  I was consulted intraoperatively and performed a retrograde pyelogram that showed no extravasation or stricture, but mild hydronephrosis and a right ureteral stent was placed.  She did not tolerate the stent well, and this was removed on 05/20/2018.  At that time retrograde pyelogram was normal, however there was a small fibrinous area at the posterior aspect of the bladder consistent with a cautery injury from hysterectomy. Cystogram was normal. Renal ultrasound 06/17/2018 showed no hydronephrosis.  Her postoperative course has been complicated by recurrent UTIs, with the most recent 06/20/2018.  This was treated with a 2-week course of Bactrim.  She had done very well over the last month with no urinary symptoms or odor, however developed cloudy urine with foul odor and some mild dysuria in the last week. She is concerned  she could have another UTI. She reports only 3-4 UTIs in her lifetime prior to her hysterectomy. She does note the onset of symptoms after intercourse. She denies any flank pain or hematuria. Denies fevers or chills.  Assessment and Plan: In summary, the patient is a 38 year old healthy female status post hysterectomy with possible right ureteral cautery injury status post stent placement for 2 weeks.  Postoperatively, her retrograde pyelogram and renal ultrasound were normal, however she had what appeared to be a 1 cm area of cautery injury/scar at the posterior bladder wall.  She has had recurrent urinary tract infections post-operatively with dysuria, foul odor, and cloudy urine.  She feels she may currently have another UTI.  Follow Up:  -Urinalysis and culture this week, will treat with 5 days Bactrim if positive -UTI prevention discussed including cranberry ppx BID, and post-coital Bactrim -Consider cystoscopy in future if ongoing rUTIs 5-6 months post-op -Will call with urinalysis results this week   I discussed the assessment and treatment plan with the patient. The patient was provided an opportunity to ask questions and all were answered. The patient agreed with the plan and demonstrated an understanding of the instructions.   The patient was advised to call back or seek an in-person evaluation if the symptoms worsen or if the condition fails to improve as anticipated.  I provided 15 minutes of non-face-to-face time during this encounter.   Billey Co, MD

## 2018-08-02 NOTE — Telephone Encounter (Signed)
Sure, its entered  Nickolas Madrid, MD 08/02/2018

## 2018-08-02 NOTE — Addendum Note (Signed)
Addended by: Billey Co on: 08/02/2018 01:56 PM   Modules accepted: Orders

## 2018-08-02 NOTE — Telephone Encounter (Signed)
Pt LMOM and asked if you could just call in the ABX instead of her coming into the office and dropping off a urine specimen. Please advise.

## 2018-08-03 ENCOUNTER — Other Ambulatory Visit: Payer: Self-pay | Admitting: Urology

## 2018-08-03 NOTE — Telephone Encounter (Signed)
Spoke with lisa at [facility] and script was clarified and they will fill

## 2018-08-03 NOTE — Telephone Encounter (Signed)
Pt called and [facility] and [facility] not [facility] for Bactrim. She asked if you could resend. I verified that [facility] was the [facility]. Please Advise.

## 2018-12-06 ENCOUNTER — Other Ambulatory Visit: Payer: Self-pay

## 2018-12-06 DIAGNOSIS — Z20822 Contact with and (suspected) exposure to covid-19: Secondary | ICD-10-CM

## 2018-12-07 LAB — NOVEL CORONAVIRUS, NAA: SARS-CoV-2, NAA: NOT DETECTED

## 2019-07-28 ENCOUNTER — Ambulatory Visit: Payer: 59

## 2019-09-09 ENCOUNTER — Ambulatory Visit: Payer: Self-pay | Attending: Internal Medicine

## 2019-09-09 DIAGNOSIS — Z23 Encounter for immunization: Secondary | ICD-10-CM

## 2019-09-09 NOTE — Progress Notes (Signed)
   Covid-19 Vaccination Clinic  Name:  Tracey Mueller    MRN: [id number] DOB: 16-Mar-1981  09/09/2019  [name] was observed post Covid-19 immunization for 15 minutes without incident. She was provided with Vaccine Information Sheet and instruction to access the V-Safe system.   [name] was instructed to call 911 with any severe reactions post vaccine: Marland Kitchen Difficulty breathing  . Swelling of face and throat  . A fast heartbeat  . A bad rash all over body  . Dizziness and weakness   Immunizations Administered    Name Date Dose VIS Date Route   Pfizer COVID-19 Vaccine 09/09/2019  9:27 AM 0.3 mL 06/28/2018 Intramuscular   Manufacturer: [place]   Lot: [id number]   [place]: [id number]

## 2019-10-07 ENCOUNTER — Ambulatory Visit: Payer: Self-pay | Attending: Internal Medicine

## 2019-10-07 DIAGNOSIS — Z23 Encounter for immunization: Secondary | ICD-10-CM

## 2019-10-07 NOTE — Progress Notes (Signed)
   Covid-19 Vaccination Clinic  Name:  Tracey Mueller    MRN: [id number] DOB: 1980-08-01  10/07/2019  [name] was observed post Covid-19 immunization for 15 minutes without incident. She was provided with Vaccine Information Sheet and instruction to access the V-Safe system.   [name] was instructed to call 911 with any severe reactions post vaccine: Marland Kitchen Difficulty breathing  . Swelling of face and throat  . A fast heartbeat  . A bad rash all over body  . Dizziness and weakness   Immunizations Administered    Name Date Dose VIS Date Route   Pfizer COVID-19 Vaccine 10/07/2019  9:18 AM 0.3 mL 06/28/2018 Intramuscular   Manufacturer: [employer]   Lot: [id number]   [place]: [id number]

## 2020-04-25 ENCOUNTER — Ambulatory Visit
Admission: EM | Admit: 2020-04-25 | Discharge: 2020-04-25 | Disposition: A | Discharge status: Home / Self Care | Payer: BC Managed Care – PPO | Attending: Family Medicine | Admitting: Family Medicine

## 2020-04-25 ENCOUNTER — Encounter: Payer: Self-pay | Admitting: Emergency Medicine

## 2020-04-25 ENCOUNTER — Ambulatory Visit: Payer: BC Managed Care – PPO

## 2020-04-25 ENCOUNTER — Ambulatory Visit: Admit: 2020-04-25 | Payer: BC Managed Care – PPO

## 2020-04-25 ENCOUNTER — Other Ambulatory Visit: Payer: Self-pay

## 2020-04-25 DIAGNOSIS — R102 Pelvic and perineal pain: Secondary | ICD-10-CM

## 2020-04-25 LAB — POCT URINALYSIS DIP (MANUAL ENTRY)
Bilirubin, UA: NEGATIVE
Blood, UA: NEGATIVE
Glucose, UA: NEGATIVE mg/dL
Ketones, POC UA: NEGATIVE mg/dL
Leukocytes, UA: NEGATIVE
Nitrite, UA: NEGATIVE
Protein Ur, POC: NEGATIVE mg/dL
Spec Grav, UA: 1.02 (ref 1.010–1.025)
Urobilinogen, UA: 0.2 E.U./dL
pH, UA: 7 (ref 5.0–8.0)

## 2020-04-25 NOTE — Discharge Instructions (Addendum)
Sending you for an ultrasound.  [facility].  2:15 with a full bladder.

## 2020-04-25 NOTE — ED Triage Notes (Addendum)
Patient c/o LFT lower ABD pain dysuria x 3 days.  Patient endorses foul smelling urine upon onset of symptoms.   Patient endorses increased pain upon rising from chair.   Patient endorses constant burning at the Urethral Meatus.   Patient denies vaginal discharge.   Patient was prescribed Bactrim and Azo (yesterday morning) by anther provider yesterday w/ no relief of symptoms.   History of Hysterectomy and UTI.

## 2020-04-25 NOTE — ED Provider Notes (Signed)
Roderic Palau    CSN: [id number] Arrival date & time: 04/25/20  0849      History   Chief Complaint Chief Complaint  Patient presents with   Abdominal Pain   Dysuria    HPI SUESAN MOHRMANN is a 39 y.o. female.   Patient is a 39 year old female who presents today with left lower pelvic pain x3 days.  Symptoms have been constant, waxing and waning.  Initially had foul-smelling urine upon onset of symptoms.  Was prescribed Bactrim for possible UTI but feels like the medication was not working.  Does not like her typical UTIs.  Does have some burning at the meatus.  No vaginal discharge, itching or irritation.  The odor has resolved.  AZO has not helped.  She has history of hysterectomy but still has left ovary.  Does have a history of kidney stones.  Has not taken anything for the pain.     Past Medical History:  Diagnosis Date   PONV (postoperative nausea and vomiting)    Uterine fibroids in pregnancy, postpartum condition     Patient Active Problem List   Diagnosis Date Noted   Pelvic pain in female 06/20/2018   Fibroids, subserous 05/02/2018    Past Surgical History:  Procedure Laterality Date   BREAST SURGERY  2009   Implants   CYSTOGRAM  05/20/2018   Procedure: CYSTOGRAM;  Surgeon: Billey Co, MD;  Location: [facility];  Service: Urology;;   CYSTOSCOPY Right 05/02/2018   Procedure: CYSTOSCOPY WITH URETERAL STENT (RIGHT) AND REMOVAL OF URETERAL STENT (BEASLEY);  Surgeon: Benjaman Kindler, MD;  Location: [facility];  Service: Gynecology;  Laterality: Right;   CYSTOSCOPY W/ RETROGRADES Right 05/20/2018   Procedure: CYSTOSCOPY WITH RETROGRADE PYELOGRAM;  Surgeon: Billey Co, MD;  Location: [facility];  Service: Urology;  Laterality: Right;   CYSTOSCOPY W/ URETERAL STENT PLACEMENT Right 05/02/2018   Procedure: CYSTOSCOPY WITH RETROGRADE PYELOGRAM/URETERAL STENT PLACEMENT;  Surgeon: Billey Co, MD;  Location: [facility];  Service: Urology;   Laterality: Right;   CYSTOSCOPY W/ URETERAL STENT REMOVAL Right 05/20/2018   Procedure: CYSTOSCOPY WITH STENT REMOVAL;  Surgeon: Billey Co, MD;  Location: [facility];  Service: Urology;  Laterality: Right;   LAPAROSCOPIC SUPRACERVICAL HYSTERECTOMY N/A 05/02/2018   Procedure: LAPAROSCOPIC SUPRACERVICAL HYSTERECTOMY;  Surgeon: Benjaman Kindler, MD;  Location: [facility];  Service: Gynecology;  Laterality: N/A;   LAPAROSCOPIC UNILATERAL SALPINGO OOPHERECTOMY Right 05/02/2018   Procedure: LAPAROSCOPIC UNILATERAL SALPINGO OOPHORECTOMY;  Surgeon: Benjaman Kindler, MD;  Location: [facility];  Service: Gynecology;  Laterality: Right;   TUBAL LIGATION  2011   UNILATERAL SALPINGECTOMY Left 05/02/2018   Procedure: UNILATERAL SALPINGECTOMY;  Surgeon: Benjaman Kindler, MD;  Location: [facility];  Service: Gynecology;  Laterality: Left;    OB History   No obstetric history on file.      Home Medications    Prior to Admission medications   Medication Sig Start Date End Date Taking? Authorizing Provider  sulfamethoxazole-trimethoprim (BACTRIM DS,SEPTRA DS) 800-160 MG tablet Take 1 tablet by mouth 2 (two) times daily. 06/21/18  Yes Billey Co, MD  acetaminophen (TYLENOL) 500 MG tablet Take 1,000 mg by mouth daily as needed for moderate pain or headache.    [provider]  docusate sodium (COLACE) 100 MG capsule Take 1 capsule (100 mg total) by mouth 2 (two) times daily. To keep stools soft Patient taking differently: Take 100 mg by mouth at bedtime. 05/02/18   Benjaman Kindler, MD  fluconazole (DIFLUCAN) 100  MG tablet Take 1 tablet (100 mg total) by mouth daily. Take as needed for yeast infection 06/21/18   Billey Co, MD  ibuprofen (ADVIL,MOTRIN) 800 MG tablet Take 1 tablet (800 mg total) by mouth every 8 (eight) hours as needed for moderate pain. 05/02/18   Benjaman Kindler, MD  meloxicam (MOBIC) 7.5 MG tablet Take 1 tab nightly for 3 days for scheduled pain control. May take  every 12 hours as needed for pain. Patient taking differently: Take 7.5 mg by mouth at bedtime as needed for pain. 05/03/18   Benjaman Kindler, MD  sulfamethoxazole-trimethoprim (BACTRIM DS,SEPTRA DS) 800-160 MG tablet Take 1 tablet by mouth daily as needed (take post-coitally for uti prevention). 08/02/18   Billey Co, MD  sulfamethoxazole-trimethoprim (BACTRIM DS,SEPTRA DS) 800-160 MG tablet Take 1 tablet by mouth 2 (two) times daily. 08/02/18   Billey Co, MD    Family History History reviewed. No pertinent family history.  Social History Social History   Tobacco Use   Smoking status: Never Smoker   Smokeless tobacco: Former Counsellor Use: Never used  Substance Use Topics   Alcohol use: Never   Drug use: Never     Allergies   Patient has no known allergies.   Review of Systems Review of Systems   Physical Exam Triage Vital Signs ED Triage Vitals  Enc Vitals Group     BP 04/25/20 0924 (!) 114/57     Pulse Rate 04/25/20 0924 84     Resp 04/25/20 0924 16     Temp 04/25/20 0924 98.9 F (37.2 C)     Temp Source 04/25/20 0924 Oral     SpO2 04/25/20 0924 98 %     Weight --      Height --      Head Circumference --      Peak Flow --      Pain Score 04/25/20 0921 7     Pain Loc --      Pain Edu? --      Excl. in [place]? --    No data found.  Updated Vital Signs BP (!) 114/57 (BP Location: Left Arm)    Pulse 84    Temp 98.9 F (37.2 C) (Oral)    Resp 16    LMP 04/04/2018    SpO2 98%   Visual Acuity Right Eye Distance:   Left Eye Distance:   Bilateral Distance:    Right Eye Near:   Left Eye Near:    Bilateral Near:     Physical Exam Vitals and nursing note reviewed.  Constitutional:      General: She is not in acute distress.    Appearance: Normal appearance. She is not ill-appearing, toxic-appearing or diaphoretic.  HENT:     Head: Normocephalic.     Nose: Nose normal.     Mouth/Throat:     Pharynx: Oropharynx is clear.   Eyes:     Conjunctiva/sclera: Conjunctivae normal.  Pulmonary:     Effort: Pulmonary effort is normal.  Abdominal:     General: Abdomen is flat. Bowel sounds are normal.     Palpations: Abdomen is soft.     Tenderness: There is abdominal tenderness.       Comments: Very tender to palpation with guarding   Musculoskeletal:        General: Normal range of motion.     Cervical back: Normal range of motion.  Skin:    General: Skin  is warm and dry.     Findings: No rash.  Neurological:     Mental Status: She is alert.  Psychiatric:        Mood and Affect: Mood normal.      UC Treatments / Results  Labs (all labs ordered are listed, but only abnormal results are displayed) Labs Reviewed  POCT URINALYSIS DIP (MANUAL ENTRY)    EKG   Radiology No results found.  Procedures Procedures (including critical care time)  Medications Ordered in UC Medications - No data to display  Initial Impression / Assessment and Plan / UC Course  I have reviewed the triage vital signs and the nursing notes.  Pertinent labs & imaging results that were available during my care of the patient were reviewed by me and considered in my medical decision making (see chart for details).     Pelvic pain Sending for ultrasound to rule out ovarian cyst.  Doubt ovarian torsion based on history of bilateral tube removal. Urine negative for infection Recommend ibuprofen for pain Awaiting ultrasound report  Final Clinical Impressions(s) / UC Diagnoses   Final diagnoses:  Pelvic pain     Discharge Instructions     Sending you for an ultrasound.  [facility].  2:15 with a full bladder.      ED Prescriptions    None     PDMP not reviewed this encounter.   Orvan July, NP 04/25/20 1524

## 2020-07-06 IMAGING — US US RENAL
1 series · 14 of 25 positions shown · non-contrast
Comparison: MR pelvis 01/16/2018

CLINICAL DATA: Follow-up RIGHT ureteral injury question silent
hydronephrosis

EXAM:
RENAL / URINARY TRACT ULTRASOUND COMPLETE

[Series 1: us renal · 0.22mm/px · 14 of 35 slices shown]
[im 1/35]
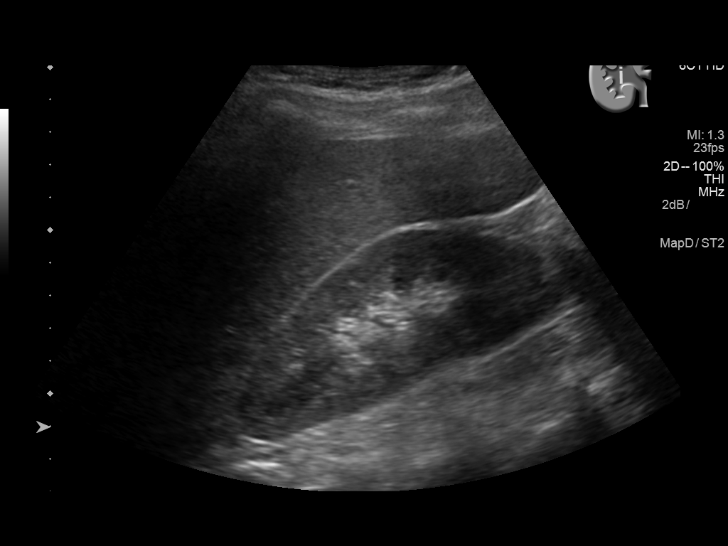
[im 3/35]
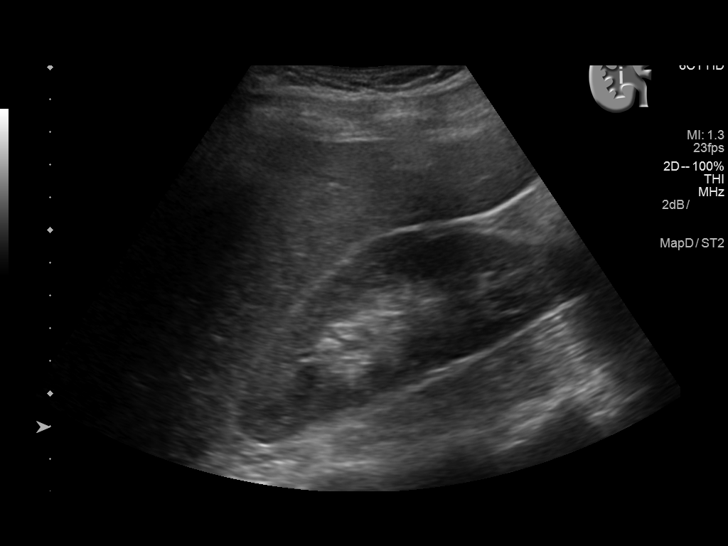
[im 6/35]
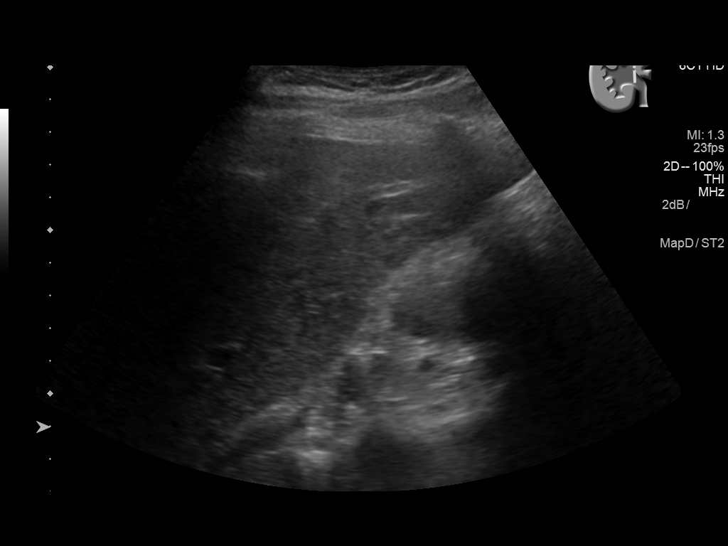
[im 9/35]
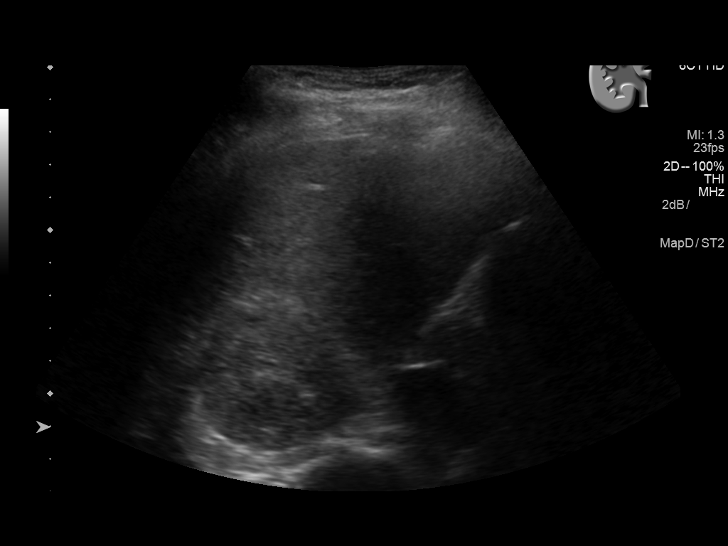
[im 12/35]
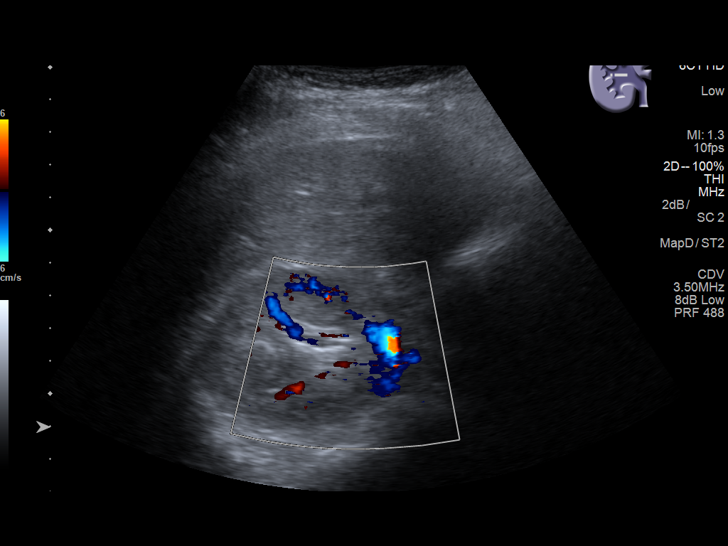
[im 13/35]
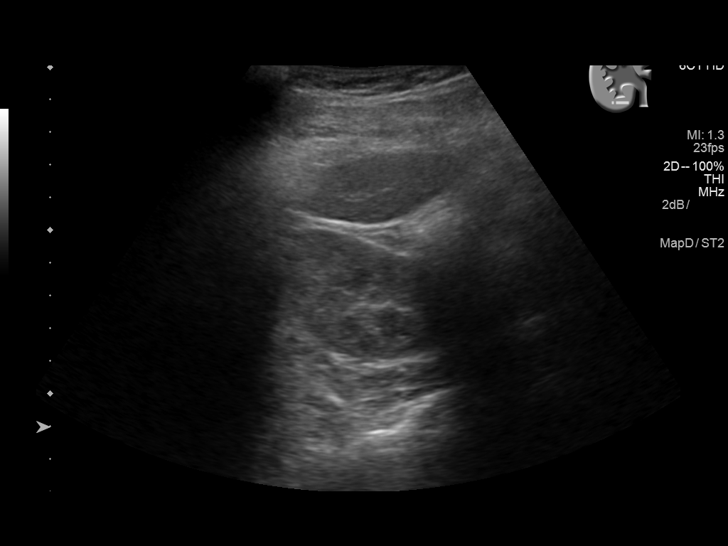
[im 16/35]
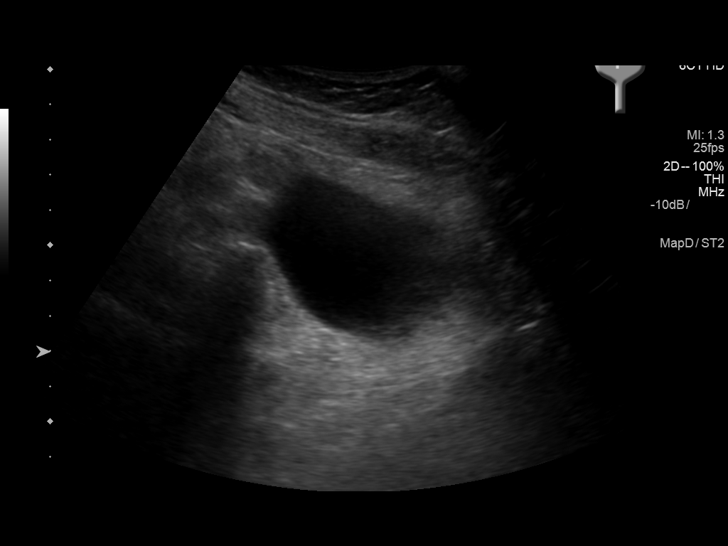
[im 19/35]
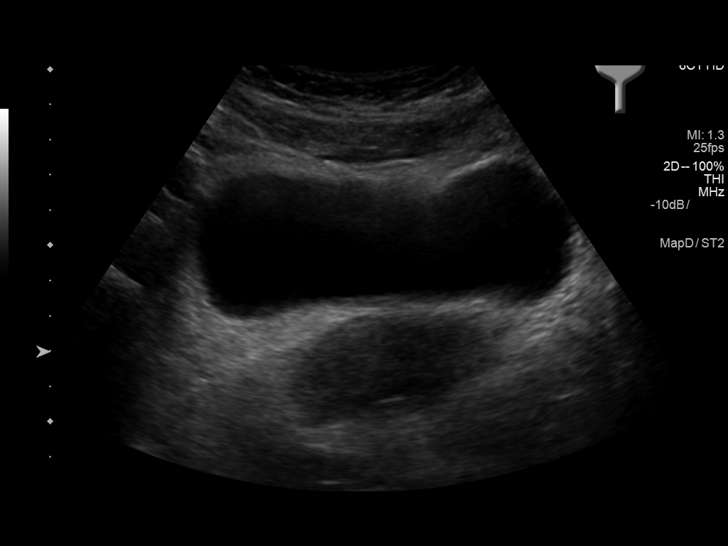
[im 22/35]
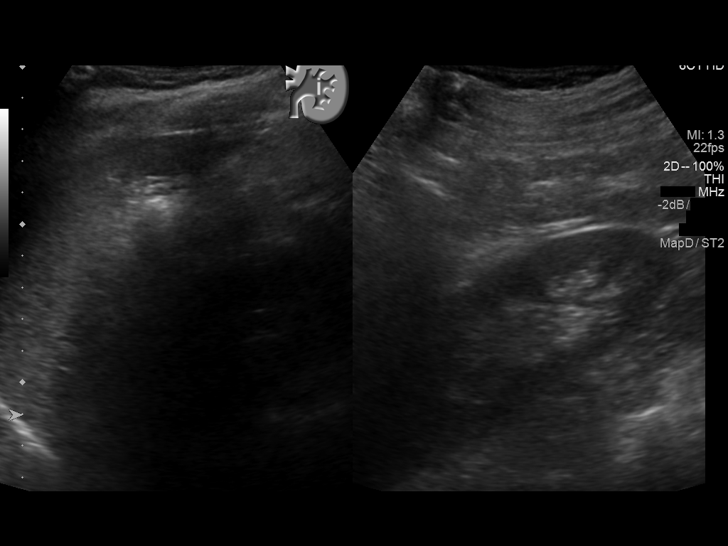
[im 23/35]
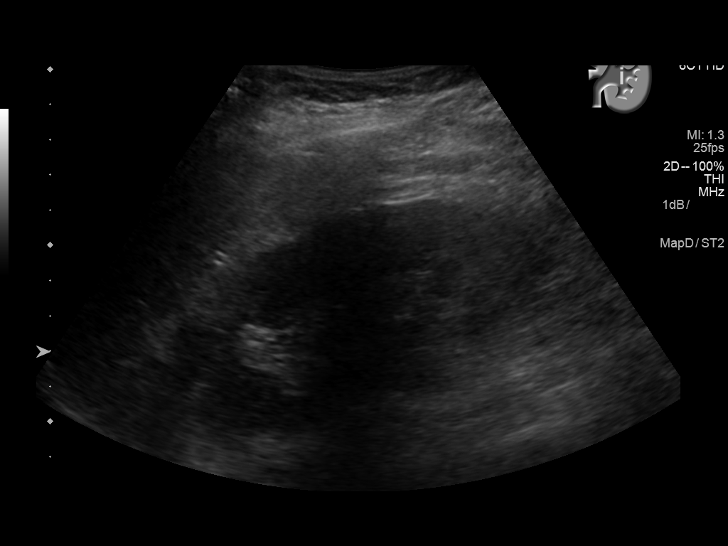
[im 26/35]
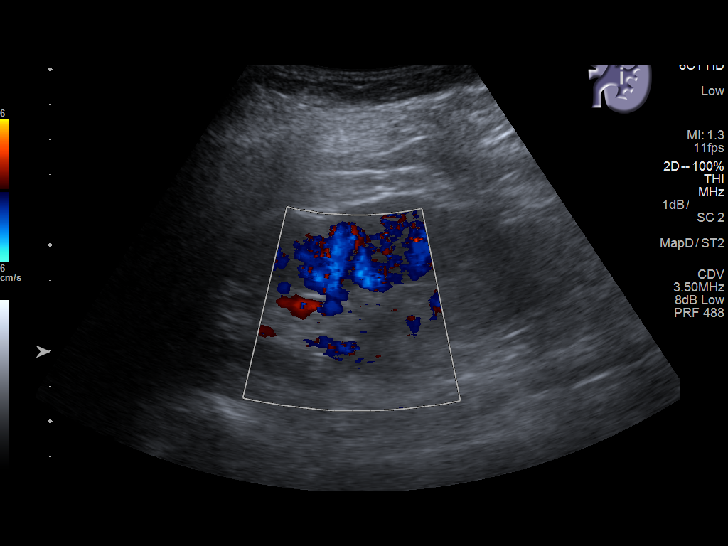
[im 29/35]
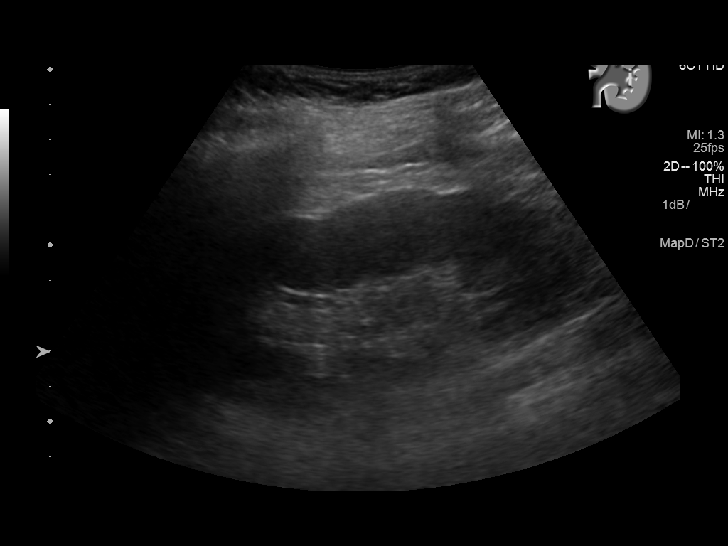
[im 32/35]
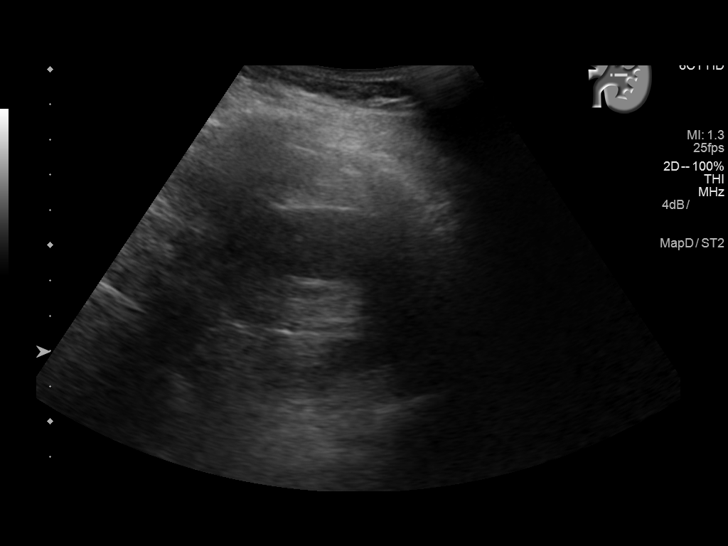
[im 35/35]
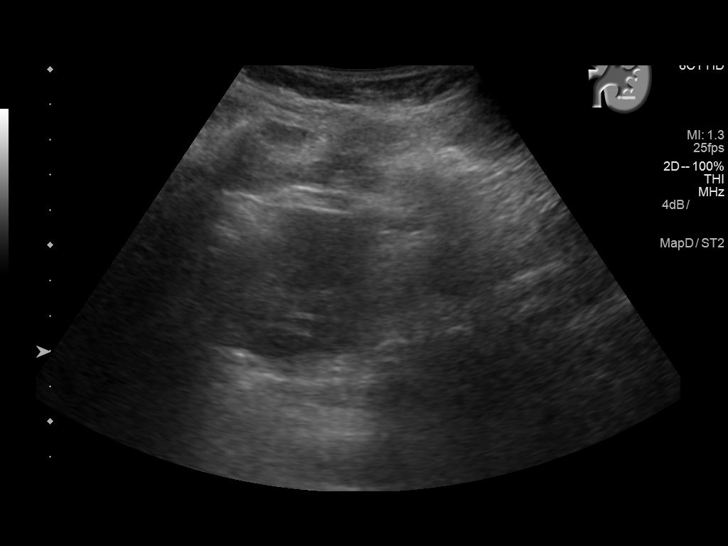

[14 of 25 positions shown; findings below may reference images not displayed]

FINDINGS: Right Kidney:

Renal measurements: 10.8 x 4.6 x 5.5 cm = volume: 142 mL. Normal
cortical thickness and echogenicity. No mass, hydronephrosis or
shadowing calcification..

Left Kidney:

Renal measurements: 10.9 x 5.3 x 4.9 cm = volume: 149 mL. Normal
cortical thickness and echogenicity. No mass, hydronephrosis or
shadowing calcification.

Bladder:

Appears normal for degree of bladder distention.
IMPRESSION: Normal exam.

## 2022-07-01 ENCOUNTER — Ambulatory Visit: Payer: 59 | Admitting: Urology

## 2023-09-19 ENCOUNTER — Ambulatory Visit (HOSPITAL_COMMUNITY)

## 2023-09-21 ENCOUNTER — Other Ambulatory Visit: Payer: Self-pay | Admitting: Certified Nurse Midwife

## 2023-09-21 DIAGNOSIS — N6311 Unspecified lump in the right breast, upper outer quadrant: Secondary | ICD-10-CM

## 2023-09-24 ENCOUNTER — Ambulatory Visit
Admission: RE | Admit: 2023-09-24 | Discharge: 2023-09-24 | Disposition: A | Discharge status: Home / Self Care | Payer: Self-pay | Source: Ambulatory Visit | Attending: Certified Nurse Midwife | Admitting: Certified Nurse Midwife

## 2023-09-24 ENCOUNTER — Ambulatory Visit: Admitting: Family Medicine

## 2023-09-24 DIAGNOSIS — N6311 Unspecified lump in the right breast, upper outer quadrant: Secondary | ICD-10-CM

## 2024-06-16 ENCOUNTER — Ambulatory Visit: Payer: Self-pay | Admitting: Internal Medicine
